# Patient Record
Sex: Male | Born: 1955 | Race: Black or African American | Hispanic: No | Marital: Married | Smoking: Former smoker
Health system: Southern US, Community
[De-identification: ages and names within clinical notes are randomized; demographics above are authoritative.]

## PROBLEM LIST (undated history)

## (undated) DIAGNOSIS — M545 Low back pain, unspecified: Secondary | ICD-10-CM

## (undated) DIAGNOSIS — J449 Chronic obstructive pulmonary disease, unspecified: Secondary | ICD-10-CM

## (undated) DIAGNOSIS — K219 Gastro-esophageal reflux disease without esophagitis: Secondary | ICD-10-CM

## (undated) HISTORY — DX: Gastro-esophageal reflux disease without esophagitis: K21.9

## (undated) HISTORY — DX: Low back pain, unspecified: M54.50

## (undated) HISTORY — DX: Chronic obstructive pulmonary disease, unspecified: J44.9

## (undated) HISTORY — DX: Low back pain: M54.5

---

## 1997-07-15 ENCOUNTER — Emergency Department (HOSPITAL_COMMUNITY): Admission: EM | Admit: 1997-07-15 | Discharge: 1997-07-15 | Payer: Self-pay | Admitting: Emergency Medicine

## 2006-11-22 ENCOUNTER — Encounter: Payer: Self-pay | Admitting: Internal Medicine

## 2006-11-22 ENCOUNTER — Ambulatory Visit: Payer: Self-pay | Admitting: Internal Medicine

## 2006-11-22 DIAGNOSIS — M25519 Pain in unspecified shoulder: Secondary | ICD-10-CM | POA: Insufficient documentation

## 2006-11-22 DIAGNOSIS — M545 Low back pain, unspecified: Secondary | ICD-10-CM | POA: Insufficient documentation

## 2006-11-22 DIAGNOSIS — F411 Generalized anxiety disorder: Secondary | ICD-10-CM

## 2006-11-22 DIAGNOSIS — J45909 Unspecified asthma, uncomplicated: Secondary | ICD-10-CM | POA: Insufficient documentation

## 2006-11-22 LAB — CONVERTED CEMR LAB
ALT: 25 units/L (ref 0–53)
AST: 21 units/L (ref 0–37)
Alkaline Phosphatase: 48 units/L (ref 39–117)
BUN: 10 mg/dL (ref 6–23)
Basophils Absolute: 0 10*3/uL (ref 0.0–0.1)
Basophils Relative: 0.9 % (ref 0.0–1.0)
Bilirubin, Direct: 0.1 mg/dL (ref 0.0–0.3)
CO2: 30 meq/L (ref 19–32)
Eosinophils Relative: 2.5 % (ref 0.0–5.0)
GFR calc Af Amer: 90 mL/min
GFR calc non Af Amer: 74 mL/min
HCT: 42.1 % (ref 39.0–52.0)
HDL: 33.2 mg/dL — ABNORMAL LOW (ref >39.0)
Ketones, ur: NEGATIVE mg/dL
Lymphocytes Relative: 31.1 % (ref 12.0–46.0)
MCV: 92.2 fL (ref 78.0–100.0)
Monocytes Relative: 6.2 % (ref 3.0–11.0)
PSA: 0.29 ng/mL (ref 0.10–4.00)
Platelets: 172 10*3/uL (ref 150–400)
Potassium: 4 meq/L (ref 3.5–5.1)
RBC: 4.56 M/uL (ref 4.22–5.81)
RDW: 12.3 % (ref 11.5–14.6)
Total CHOL/HDL Ratio: 5.1
Triglycerides: 98 mg/dL (ref 0–149)
VLDL: 20 mg/dL (ref 0–40)
pH: 6 (ref 5.0–8.0)

## 2007-03-29 ENCOUNTER — Ambulatory Visit: Payer: Self-pay | Admitting: Internal Medicine

## 2007-03-29 DIAGNOSIS — J069 Acute upper respiratory infection, unspecified: Secondary | ICD-10-CM | POA: Insufficient documentation

## 2007-03-29 DIAGNOSIS — J449 Chronic obstructive pulmonary disease, unspecified: Secondary | ICD-10-CM | POA: Insufficient documentation

## 2009-05-30 ENCOUNTER — Encounter: Payer: Self-pay | Admitting: Internal Medicine

## 2009-06-17 ENCOUNTER — Ambulatory Visit: Payer: Self-pay | Admitting: Internal Medicine

## 2009-06-17 ENCOUNTER — Encounter (INDEPENDENT_AMBULATORY_CARE_PROVIDER_SITE_OTHER): Payer: Self-pay | Admitting: *Deleted

## 2009-06-17 DIAGNOSIS — K219 Gastro-esophageal reflux disease without esophagitis: Secondary | ICD-10-CM | POA: Insufficient documentation

## 2009-06-17 DIAGNOSIS — R05 Cough: Secondary | ICD-10-CM | POA: Insufficient documentation

## 2009-08-05 ENCOUNTER — Ambulatory Visit: Payer: Self-pay | Admitting: Internal Medicine

## 2009-08-05 DIAGNOSIS — R42 Dizziness and giddiness: Secondary | ICD-10-CM

## 2009-08-05 DIAGNOSIS — R519 Headache, unspecified: Secondary | ICD-10-CM | POA: Insufficient documentation

## 2009-08-05 DIAGNOSIS — R51 Headache: Secondary | ICD-10-CM

## 2009-08-05 DIAGNOSIS — J310 Chronic rhinitis: Secondary | ICD-10-CM | POA: Insufficient documentation

## 2009-08-05 DIAGNOSIS — J019 Acute sinusitis, unspecified: Secondary | ICD-10-CM

## 2009-09-02 ENCOUNTER — Ambulatory Visit: Payer: Self-pay | Admitting: Internal Medicine

## 2009-09-10 ENCOUNTER — Encounter: Payer: Self-pay | Admitting: Internal Medicine

## 2009-09-23 ENCOUNTER — Encounter: Admission: RE | Admit: 2009-09-23 | Discharge: 2009-09-23 | Payer: Self-pay | Admitting: Internal Medicine

## 2009-10-06 ENCOUNTER — Encounter: Payer: Self-pay | Admitting: Internal Medicine

## 2009-10-07 ENCOUNTER — Encounter: Payer: Self-pay | Admitting: Internal Medicine

## 2009-10-07 ENCOUNTER — Encounter (INDEPENDENT_AMBULATORY_CARE_PROVIDER_SITE_OTHER): Payer: Self-pay | Admitting: *Deleted

## 2009-10-07 ENCOUNTER — Ambulatory Visit: Payer: Self-pay

## 2009-10-10 ENCOUNTER — Ambulatory Visit: Payer: Self-pay

## 2009-10-10 ENCOUNTER — Encounter: Payer: Self-pay | Admitting: Internal Medicine

## 2009-10-10 ENCOUNTER — Ambulatory Visit: Payer: Self-pay | Admitting: Internal Medicine

## 2009-10-10 ENCOUNTER — Ambulatory Visit (HOSPITAL_COMMUNITY): Admission: RE | Admit: 2009-10-10 | Discharge: 2009-10-10 | Payer: Self-pay | Admitting: Internal Medicine

## 2009-10-23 ENCOUNTER — Telehealth: Payer: Self-pay | Admitting: Internal Medicine

## 2010-03-08 ENCOUNTER — Encounter: Payer: Self-pay | Admitting: Internal Medicine

## 2010-03-15 LAB — CONVERTED CEMR LAB
BUN: 11 mg/dL (ref 6–23)
Basophils Relative: 0.5 % (ref 0.0–3.0)
Bilirubin, Direct: 0.1 mg/dL (ref 0.0–0.3)
Chloride: 106 meq/L (ref 96–112)
Cholesterol: 170 mg/dL (ref 0–200)
Creatinine, Ser: 1.2 mg/dL (ref 0.4–1.5)
Glucose, Bld: 88 mg/dL (ref 70–99)
HCT: 43.2 % (ref 39.0–52.0)
Hemoglobin: 14.5 g/dL (ref 13.0–17.0)
Ketones, ur: NEGATIVE mg/dL
Leukocytes, UA: NEGATIVE
Lymphs Abs: 1.1 10*3/uL (ref 0.7–4.0)
MCHC: 33.6 g/dL (ref 30.0–36.0)
Nitrite: NEGATIVE
Platelets: 194 10*3/uL (ref 150.0–400.0)
RBC: 4.67 M/uL (ref 4.22–5.81)
Specific Gravity, Urine: <=1.005 (ref 1.000–1.030)
Total Bilirubin: 0.6 mg/dL (ref 0.3–1.2)
Total CHOL/HDL Ratio: 6
Total Protein, Urine: NEGATIVE mg/dL
Total Protein: 7.3 g/dL (ref 6.0–8.3)
Triglycerides: 71 mg/dL (ref 0.0–149.0)
VLDL: 14.2 mg/dL (ref 0.0–40.0)
WBC: 4.3 10*3/uL — ABNORMAL LOW (ref 4.5–10.5)

## 2010-03-17 NOTE — Assessment & Plan Note (Signed)
Summary: dizziness/plot/cd   Vital Signs:  Patient profile:   55 year old male Height:      71 inches Weight:      195.50 pounds BMI:     27.37 O2 Sat:      97 % on Room air Temp:     99.4 degrees F oral Pulse rate:   65 / minute BP sitting:   136 / 82  (left arm) Cuff size:   regular  Vitals Entered By: Zella Ball Ewing CMA (AAMA) (September 02, 2009 1:36 PM)  O2 Flow:  Room air CC: Dizzy, head pressure, nauseated/RE   CC:  Dizzy, head pressure, and nauseated/RE.  History of Present Illness: here with dizzy/vertigo, HA and nausea  - still has symptoms despite being seen lasat month;  can still function and work but ha recurring dizziness, head pressure and nausea, sometimes assoc "quiviering" near the left ear;  some pressure ot the areas behnd the ears;  also occasional numbness to the left cheek area but infreq, and only once to the right sinus area 2 days;  Kenneth [name] gets a "rush feleling"  quick and fleeting to the center of the head but doesnt last long enough to get more dizzy or pass out;  feels "foggy" as well;  augmentin tx  makes him  feel "high" but tried to take twice;  also last night for the first time had trouble sleeping  (and hwy he is here today) with nausea, anxiety on lying down  and after oatmeal this am as well.  he is unaware of fevers but has low grade temp today.  Not taking his flonase and clariitn.  No lightheadedness,  Pt denies CP, sob, doe, wheezing, orthopnea, pnd, worsening LE edema, palps, or syncope except for  a fleeting sharp pain to the left costal  margin  - last occurred last wk, non exertionla, nonpleuritic.  Walks the track for excericse  every other day (but not this wk due to the heat).  Stopped coffee x 2 mo, no ETOH, tob, illicit drugs.   Did have excruciating pain to the left neck rad to the left upper back and shoulder after golf, but resolved with PT/integrative therapy.    Preventive Screening-Counseling & Management      Drug Use:  no.     Problems Prior to Update: 1)  Postural Lightheadedness  (ICD-780.4) 2)  Dizziness  (ICD-780.4) 3)  Sinusitis, Acute  (ICD-461.9) 4)  Headache  (ICD-784.0) 5)  Rhinitis, Chronic  (ICD-472.0) 6)  Vertigo  (ICD-780.4) 7)  Physical Examination  (ICD-V70.0) 8)  Cough  (ICD-786.2) 9)  Gerd  (ICD-530.81) 10)  Upper Respiratory Infection (URI)  (ICD-465.9) 11)  COPD  (ICD-496) 12)  Asthma  (ICD-493.90) 13)  Anxiety  (ICD-300.00) 14)  Shoulder Pain  (ICD-719.41) 15)  Low Back Pain  (ICD-724.2)  Medications Prior to Update: 1)  Lorazepam 0.5 Mg Tabs (Lorazepam) .Marland Kitchen.. 1 - 2 Two Times A Day Prn 2)  Vitamin D3 1000 Unit  Tabs (Cholecalciferol) .Marland Kitchen.. 1 Qd 3)  Pantoprazole Sodium 40 Mg Tbec (Pantoprazole Sodium) .Marland Kitchen.. 1 By Mouth Once Daily For Indigestion 4)  Proair Hfa 108 (90 Base) Mcg/act Aers (Albuterol Sulfate) .... 2 Inh Qid As Needed 5)  Flonase 50 Mcg/act Susp (Fluticasone Propionate) .Marland Kitchen.. 1 Spr Each Nostr Qd As Needed 6)  Loratadine 10 Mg Tabs (Loratadine) .Marland Kitchen.. 1 By Mouth Once Daily As Needed Allergies  Current Medications (verified): 1)  Lorazepam 0.5 Mg Tabs (Lorazepam) .Marland Kitchen.. 1 - 2  Two Times A Day Prn 2)  Vitamin D3 1000 Unit  Tabs (Cholecalciferol) .Marland Kitchen.. 1 Qd 3)  Pantoprazole Sodium 40 Mg Tbec (Pantoprazole Sodium) .Marland Kitchen.. 1 By Mouth Once Daily For Indigestion 4)  Proair Hfa 108 (90 Base) Mcg/act Aers (Albuterol Sulfate) .... 2 Inh Qid As Needed 5)  Flonase 50 Mcg/act Susp (Fluticasone Propionate) .Marland Kitchen.. 1 Spr Each Nostr Qd As Needed 6)  Loratadine 10 Mg Tabs (Loratadine) .Marland Kitchen.. 1 By Mouth Once Daily As Needed Allergies 7)  Meclizine Hcl 12.5 Mg Tabs (Meclizine Hcl) .Marland Kitchen.. 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 Occupation: Agricultural engineer. of insurance, auditing Married Former Smoker Regular exercise-yes Alcohol use-no Drug use-no Drug Use:  no  Review of Systems       all otherwise negative per pt -  except for fleeting shapr pains to ears with occas tinnitus  Physical Exam  General:  alert and overweight-appearing.  , nontoxic, occasionally smiling Head:  normocephalic and atraumatic.   Eyes:  vision grossly intact, pupils equal, and pupils round.   Ears:  left tm slight eythema Nose:  no external deformity and no nasal discharge.   Mouth:  no gingival abnormalities and pharynx pink and moist.   Neck:  supple and no masses.   Lungs:  normal respiratory effort and normal breath sounds.   Heart:  normal rate and regular rhythm.   Abdomen:  soft, non-tender, and normal bowel sounds.   Msk:  no joint tenderness and no joint swelling.   Extremities:  no edema, no erythema  Neurologic:  cranial nerves II-XII intact, strength normal in all extremities, and gait normal.     Impression & Recommendations:  Problem # 1:  DIZZINESS (ICD-780.4)  His updated medication list for this problem includes:    Loratadine 10 Mg Tabs (Loratadine) .Marland Kitchen... 1 by mouth once daily as needed allergies    Meclizine Hcl 12.5 Mg Tabs (Meclizine hcl) .Marland Kitchen... 1po q 6 hrs as needed dizzy etiology unclear; pt to re-start the sinus meds, recent may 2011 labs reviewed with pt, further tx and w/u as below  Orders: Radiology Referral (Radiology) Neurology Referral (Neuro)  Problem # 2:  VERTIGO (ICD-780.4)  His updated medication list for this problem includes:    Loratadine 10 Mg Tabs (Loratadine) .Marland Kitchen... 1 by mouth once daily as needed allergies    Meclizine Hcl 12.5 Mg Tabs  (Meclizine hcl) .Marland Kitchen... 1po q 6 hrs as needed dizzy also for meclizine as needed , and MRI, and neuro referral, also for mucinex otc two times a day as needed   Orders: Neurology Referral (Neuro)  Problem # 3:  POSTURAL LIGHTHEADEDNESS (ICD-780.4)  His updated medication list for this problem includes:    Loratadine 10 Mg Tabs (Loratadine) .Marland Kitchen... 1 by mouth once daily as needed allergies    Meclizine Hcl 12.5 Mg Tabs (Meclizine hcl) .Marland Kitchen... 1po q 6 hrs as needed dizzy also for echo, carotids  Orders: Echo Referral (Echo) Misc. Referral (Misc. Ref)  Problem # 4:  ANXIETY (ICD-300.00)  His updated medication list for this  problem includes:    Lorazepam 0.5 Mg Tabs (Lorazepam) .Marland Kitchen... 1 - 2 two times a day prn reassured - ok to take the lorazepam asd  Complete Medication List: 1)  Lorazepam 0.5 Mg Tabs (Lorazepam) .Marland Kitchen.. 1 - 2 two times a day prn 2)  Vitamin D3 1000 Unit Tabs (Cholecalciferol) .Marland Kitchen.. 1 qd 3)  Pantoprazole Sodium 40 Mg Tbec (Pantoprazole sodium) .Marland Kitchen.. 1 by mouth once daily for indigestion 4)  Proair Hfa 108 (90 Base) Mcg/act Aers (Albuterol sulfate) .... 2 inh qid as needed 5)  Flonase 50 Mcg/act Susp (Fluticasone propionate) .Marland Kitchen.. 1 spr each nostr qd as needed 6)  Loratadine 10 Mg Tabs (Loratadine) .Marland Kitchen.. 1 by mouth once daily as needed allergies 7)  Meclizine Hcl 12.5 Mg Tabs (Meclizine hcl) .Marland Kitchen.. 1po q 6 hrs as needed dizzy  Patient Instructions: 1)  Please take all new medications as prescribed - the meclzine as needed for dizziness 2)  Continue all previous medications as before this visit, including the sinus medications and lorazepam 3)  You can also use Mucinex OTC or it's generic for the symptoms 4)  You will be contacted about the referral(s) to: MRI, and neurology, as well as Echocardiogram and Carotid Dopplers 5)  Please schedule an appointment with your primary doctor in : 6)  Please schedule a follow-up appointment in 1 month or sooner if  needed Prescriptions: MECLIZINE HCL 12.5 MG TABS (MECLIZINE HCL) 1po q 6 hrs as needed dizzy  #40 x 1   Entered and Authorized by:   Corwin Levins MD   Signed by:   Corwin Levins MD on 09/02/2009   Method used:   Print then Give to Patient   RxID:   [id number]

## 2010-03-17 NOTE — Miscellaneous (Signed)
Summary: Appointment Canceled  Appointment status changed to canceled by [employer] on 10/07/2009 4:52 PM.  Cancellation Comments --------------------- echo/POSTURAL LIGHTHEADEDNESS  Appointment Information ----------------------- Appt Type:  CARDIOLOGY ANCILLARY VISIT      Date:  Tuesday, October 07, 2009      Time:  4:00 PM for 60 min   Urgency:  Routine   Made By:  Pearson Grippe  To Visit:  [id number]    Reason:  echo/POSTURAL LIGHTHEADEDNESS  Appt Comments ------------- -- 10/07/09 16:52: (CEMR) CANCELED -- echo/POSTURAL LIGHTHEADEDNESS -- 10/07/09 15:13: (CEMR) ARRIVED -- echo/POSTURAL LIGHTHEADEDNESS -- 09/22/09 8:48: (CEMR) BOOKED -- Routine CARDIOLOGY ANCILLARY VISIT at 10/07/2009 4:00 PM for 60 min echo/POSTURAL L

## 2010-03-17 NOTE — Letter (Signed)
Summary: [facility]  [facility]   Imported By: Sherian Rein 07/03/2009 09:42:46  _____________________________________________________________________  External Attachment:    Type:   Image     Comment:   External Document

## 2010-03-17 NOTE — Miscellaneous (Signed)
Summary: Appointment Canceled  Appointment status changed to canceled by [employer] on 09/22/2009 8:48 AM.  Cancellation Comments --------------------- echo/POSTURAL LIGHTHEADEDNESS  Appointment Information ----------------------- Appt Type:  CARDIOLOGY ANCILLARY VISIT      Date:  Tuesday, September 23, 2009      Time:  2:00 PM for 60 min   Urgency:  Routine   Made By:  Pearson Grippe  To Visit:  [id number]    Reason:  echo/POSTURAL LIGHTHEADEDNESS  Appt Comments ------------- -- 09/22/09 8:48: (CEMR) CANCELED -- echo/POSTURAL LIGHTHEADEDNESS -- 09/03/09 10:48: (CEMR) BOOKED -- Routine CARDIOLOGY ANCILLARY VISIT at 09/23/2009 2:00 PM for 60 min echo/POSTURAL LIGHTHEADEDNESS

## 2010-03-17 NOTE — Letter (Signed)
Summary: Outpatient Coinsurance Notice  Outpatient Coinsurance Notice   Imported By: Marylou Mccoy 10/17/2009 10:43:21  _____________________________________________________________________  External Attachment:    Type:   Image     Comment:   External Document

## 2010-03-17 NOTE — Assessment & Plan Note (Signed)
Summary: WOKE UP W/COUGH/CD   Vital Signs:  Patient profile:   55 year old Kenneth [name] Height:      71 inches Weight:      207.25 pounds (94.20 kg) BMI:     29.01 O2 Sat:      98 % on Room air Temp:     98.0 degrees F (36.67 degrees C) oral Pulse rate:   78 / minute Pulse rhythm:   regular BP sitting:   134 / 100  (left arm) Cuff size:   regular  Vitals Entered By: Brenton Grills (Jun 17, 2009 11:23 AM)  O2 Flow:  Room air CC: pt c/o dry cough usually in AM but can last all day/pt states he notices cough after drinking beverages/pt states he is no longer taking lorazepam, mobic, zithromax/pt states he may need a refill on ventolin and has a question about vitamin d3/pt may be due for tetanus/aj   CC:  pt c/o dry cough usually in AM but can last all day/pt states he notices cough after drinking beverages/pt states he is no longer taking lorazepam, mobic, and zithromax/pt states he may need a refill on ventolin and has a question about vitamin d3/pt may be due for tetanus/aj.  History of Present Illness: C/o dry hard cough spells off and on up to 5 min after he drinks x 2 years - getting worse; spells 3/wk. C/o GERD The patient presents for a wellness examination  C/o L shoulder pain - had seen ortho - now on PT  Current Medications (verified): 1)  Lorazepam 0.5 Mg Tabs (Lorazepam) .Marland Kitchen.. 1 - 2 Two Times A Day Prn 2)  Mobic 15 Mg Tabs (Meloxicam) .... 1/2 or 1 By Mouth Once Daily Pc Prn 3)  Ventolin Hfa 108 (90 Base) Mcg/act  Aers (Albuterol Sulfate) .... 2 Inh Qid Prn 4)  Zithromax Z-Pak 250 Mg  Tabs (Azithromycin) .... As Directed 5)  Vitamin D3 1000 Unit  Tabs (Cholecalciferol) .Marland Kitchen.. 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 F lymphoma  Social History: Occupation: Agricultural engineer. of insurance, auditing Married Former Smoker Regular exercise-yes Alcohol use-no  Review of Systems  The patient denies anorexia, fever, weight loss, weight gain, vision loss, decreased hearing, hoarseness, chest pain, syncope, dyspnea on exertion, peripheral edema, prolonged cough, headaches, hemoptysis, abdominal pain, melena, hematochezia, severe indigestion/heartburn, hematuria, incontinence, genital sores, muscle weakness, suspicious skin lesions, transient blindness, difficulty walking, depression, unusual weight change, abnormal bleeding, enlarged lymph nodes, angioedema, and testicular masses.    Physical Exam  General:  Well-developed,well-nourished,in no acute distress; alert,appropriate and cooperative throughout examination Head:  Normocephalic and atraumatic without obvious abnormalities. No apparent alopecia or balding. Eyes:  No corneal or conjunctival inflammation noted. EOMI. Perrla Ears:  External ear exam shows no significant lesions or deformities.  Otoscopic examination reveals clear canals, tympanic membranes are intact bilaterally without bulging, retraction, inflammation or discharge. Hearing is grossly normal bilaterally. Nose:  External nasal examination shows no deformity or inflammation. Nasal mucosa are pink and moist without lesions or exudates. Mouth:  Eryth throat Neck:  No deformities, masses, or tenderness noted. Lungs:  B rhonchi Heart:  RRR Abdomen:  Bowel  sounds positive,abdomen soft and non-tender without masses, organomegaly or hernias noted. Rectal:  No external abnormalities noted. Normal sphincter tone. No rectal masses or tenderness. G(-) Prostate:  Prostate gland firm and smooth, no enlargement, nodularity, tenderness, mass, asymmetry or induration. Msk:   No deformity or scoliosis noted of thoracic or lumbar spine.   Extremities:  No clubbing, cyanosis, edema, or deformity noted with normal full range of motion of all joints.   Neurologic:  No cranial nerve deficits noted. Station and gait are normal. Plantar reflexes are down-going bilaterally. DTRs are symmetrical throughout. Sensory, motor and coordinative functions appear intact. Skin:  Intact without suspicious lesions or rashes Cervical Nodes:  No lymphadenopathy noted Inguinal Nodes:  No significant adenopathy Psych:  Cognition and judgment appear intact. Alert and cooperative with normal attention span and concentration. No apparent delusions, illusions, hallucinations   Impression & Recommendations:  Problem # 1:  PHYSICAL EXAMINATION (ICD-V70.0) Assessment New Health and age related issues were discussed. Available screening tests and vaccinations were discussed as well. Healthy life style including good diet and execise was discussed. Needs Colon  Orders: TLB-BMP (Basic Metabolic Panel-BMET) (80048-METABOL) TLB-CBC Platelet - w/Differential (85025-CBCD) TLB-Hepatic/Liver Function Pnl (80076-HEPATIC) TLB-Lipid Panel (80061-LIPID) TLB-PSA (Prostate Specific Antigen) (84153-PSA) TLB-TSH (Thyroid Stimulating Hormone) (84443-TSH) TLB-Udip ONLY (81003-UDIP)  Problem # 2:  COUGH (ICD-786.2) Assessment: New Unclear etiology. Treat GERD. GI consult. Albuterol as needed. Orders: T-2 View CXR, Same Day (71020.5TC)  Problem # 3:  GERD (ICD-530.81) Assessment: New  His updated medication list for this problem includes:    Pantoprazole Sodium 40 Mg Tbec (Pantoprazole sodium) .Marland Kitchen... 1 by mouth once daily for indigestion  Orders: Gastroenterology Referral (GI)  Problem # 4:  COPD (ICD-496) Assessment: Unchanged  The following medications were removed from the medication list:    Ventolin Hfa 108 (90 Base) Mcg/act Aers (Albuterol sulfate) .Marland Kitchen... 2 inh qid prn His updated medication  list for this problem includes:    Proair Hfa 108 (90 Base) Mcg/act Aers (Albuterol sulfate) .Marland Kitchen... 2 inh qid as needed  Complete Medication List: 1)  Lorazepam 0.5 Mg Tabs (Lorazepam) .Marland Kitchen.. 1 - 2 two times a day prn 2)  Vitamin D3 1000 Unit Tabs (Cholecalciferol) .Marland Kitchen.. 1 qd 3)  Pantoprazole Sodium 40 Mg Tbec (Pantoprazole sodium) .Marland Kitchen.. 1 by mouth once daily for indigestion 4)  Proair Hfa 108 (90 Base) Mcg/act Aers (Albuterol sulfate) .... 2 inh qid as needed  Patient Instructions: 1)  Please schedule a follow-up appointment in 3 months. Prescriptions: PROAIR HFA 108 (90 BASE) MCG/ACT AERS (ALBUTEROL SULFATE) 2 inh qid as needed  #1 x 3   Entered and Authorized by:   Tresa Garter MD   Signed by:   Tresa Garter MD on 06/17/2009   Method used:   Print then Give to Patient   RxID:   [id number] PANTOPRAZOLE SODIUM 40 MG TBEC (PANTOPRAZOLE SODIUM) 1 by mouth once daily for indigestion  #30 x 12   Entered and Authorized by:   Tresa Garter MD   Signed by:   Tresa Garter MD on 06/17/2009   Method used:   Print then Give to Patient   RxID:   [id number]

## 2010-03-17 NOTE — Letter (Signed)
Summary: New Patient letter  [facility]  [address] [place], [place] [address]   Phone: [phone]  Fax: [phone]       06/17/2009 MRN: [id number]  Kenneth [name] [address] [place], [place]  [address]  Dear Mr. Kenneth [name],  Welcome to the Gastroenterology Division at [facility].    You are scheduled to see Dr.  Leone Payor on 07-17-09 at 2:45PM on the 3rd floor at [facility], [address]  We ask that you try to arrive at our office 15 minutes prior to your appointment time to allow for check-in.  We would like you to complete the enclosed self-administered evaluation form prior to your visit and bring it with you on the day of your appointment.  We will review it with you.  Also, please bring a complete list of all your medications or, if you prefer, bring the medication bottles and we will list them.  Please bring your insurance card so that we may make a copy of it.  If your insurance requires a referral to see a specialist, please bring your referral form from your primary care physician.  Co-payments are due at the time of your visit and may be paid by cash, check or credit card.     Your office visit will consist of a consult with your physician (includes a physical exam), any laboratory testing he/she may order, scheduling of any necessary diagnostic testing (e.g. x-ray, ultrasound, CT-scan), and scheduling of a procedure (e.g. Endoscopy, Colonoscopy) if required.  Please allow enough time on your schedule to allow for any/all of these possibilities.    If you cannot keep your appointment, please call [phone] to cancel or reschedule prior to your appointment date.  This allows [place] the opportunity to schedule an appointment for another patient in need of care.  If you do not cancel or reschedule by 5 p.m. the business day prior to your appointment date, you will be charged a $50.00 late cancellation/no-show fee.    Thank you for choosing [facility] for your medical needs.  We appreciate the opportunity to care for you.  Please visit [place] at our website  to learn more about our practice.                     Sincerely,                                                             The Gastroenterology Division

## 2010-03-17 NOTE — Consult Note (Signed)
Summary: [facility]  [facility]   Imported By: Lester [employer] 09/15/2009 10:18:40  _____________________________________________________________________  External Attachment:    Type:   Image     Comment:   External Document

## 2010-03-17 NOTE — Progress Notes (Signed)
Summary: REFERRAL  Phone Note Call from Patient Call back at Home Phone [phone] Call back at [phone]   Summary of Call: Patient is requesting referral to ENT. Continues to c/o humming in his ears.  Initial call taken by: Lamar Sprinkles, CMA,  October 23, 2009 9:58 AM  Follow-up for Phone Call        ok Follow-up by: Tresa Garter MD,  October 23, 2009 12:40 PM

## 2010-03-17 NOTE — Assessment & Plan Note (Signed)
Summary: dizziness/#/cd   Vital Signs:  Patient profile:   55 year old male Weight:      195 pounds BMI:     27.30 O2 Sat:      96 % on Room air Temp:     97.6 degrees F oral Pulse rate:   70 / minute Resp:     16 per minute BP supine:   130 / 80 BP sitting:   130 / 80  (left arm) BP standing:   130 / 80 Cuff size:   regular  Vitals Entered By: Waldron Labs, CMA(AAMA) (August 05, 2009 4:44 PM)  O2 Flow:  Room air CC: dizziness  Comments pt states he is not taking Lorazepam, Vitamin D, Pantoprazole or ProAir.   CC:  dizziness .  History of Present Illness: C/o dizziness x 1 months and nasal congestion x 1 months; a lot of nasal drainage and HA, "foggy". It has started after a cold episode... F/u anxiety  Current Medications (verified): 1)  Lorazepam 0.5 Mg Tabs (Lorazepam) .Marland Kitchen.. 1 - 2 Two Times A Day Prn 2)  Vitamin D3 1000 Unit  Tabs (Cholecalciferol) .Marland Kitchen.. 1 Qd 3)  Pantoprazole Sodium 40 Mg Tbec (Pantoprazole Sodium) .Marland Kitchen.. 1 By Mouth Once Daily For Indigestion 4)  Proair Hfa 108 (90 Base) Mcg/act Aers (Albuterol Sulfate) .... 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         HA  Physical Exam  General:  Well-developed,well-nourished,in no acute distress; alert,appropriate and cooperative throughout examination Ears:  External ear exam shows no significant lesions or deformities.  Otoscopic examination reveals clear canals, tympanic membranes are intact bilaterally without bulging, retraction, inflammation or discharge. Hearing is grossly  normal bilaterally. Nose:  Erythematous throat and intranasal mucosa c/w URI  Nasal passages are very swollen, white d/c Neck:  No deformities, masses, or tenderness noted. Lungs:  B rhonchi Heart:  RRR Abdomen:  Bowel sounds positive,abdomen soft and non-tender without masses, organomegaly or hernias noted. Msk:  No deformity or scoliosis noted of thoracic or lumbar spine.   Extremities:  No clubbing, cyanosis, edema, or deformity noted with normal full range of motion of all joints.   Neurologic:  No cranial nerve deficits noted. Station and gait are normal. Plantar reflexes are down-going bilaterally. DTRs are symmetrical throughout. Sensory, motor and coordinative functions appear intact. H-P (-) B Skin:  Intact without suspicious lesions or rashes Psych:  Cognition and judgment appear intact. Alert and cooperative with normal attention span and concentration. No apparent delusions, illusions, hallucinations   Impression & Recommendations:  Problem # 1:  VERTIGO (ICD-780.4) - poss sinusitis Assessment New  His updated medication list for this problem includes:    Loratadine 10 Mg Tabs (Loratadine) .Marland Kitchen... 1 by mouth once daily as needed allergies  Problem # 2:  RHINITIS, CHRONIC (ICD-472.0) Assessment: Deteriorated Flonase Claritin  Problem # 3:  HEADACHE (ICD-784.0) due to sinusitis Assessment: New CT if not better Augmentin  Problem # 4:  ANXIETY (ICD-300.00) Assessment: Unchanged  His updated medication list for this problem includes:    Lorazepam 0.5 Mg Tabs (Lorazepam) .Marland Kitchen... 1 - 2 two  times a day prn  Problem # 5:  SINUSITIS, ACUTE (ICD-461.9) Assessment: New  His updated medication list for this problem includes:    Flonase 50 Mcg/act Susp (Fluticasone propionate) .Marland Kitchen... 1 spr each nostr qd as needed    Augmentin 875-125 Mg Tabs (Amoxicillin-pot clavulanate) .Marland Kitchen... 1 by mouth bid  Complete Medication List: 1)  Lorazepam 0.5 Mg Tabs (Lorazepam) .Marland Kitchen.. 1 - 2 two times  a day prn 2)  Vitamin D3 1000 Unit Tabs (Cholecalciferol) .Marland Kitchen.. 1 qd 3)  Pantoprazole Sodium 40 Mg Tbec (Pantoprazole sodium) .Marland Kitchen.. 1 by mouth once daily for indigestion 4)  Proair Hfa 108 (90 Base) Mcg/act Aers (Albuterol sulfate) .... 2 inh qid as needed 5)  Flonase 50 Mcg/act Susp (Fluticasone propionate) .Marland Kitchen.. 1 spr each nostr qd as needed 6)  Loratadine 10 Mg Tabs (Loratadine) .Marland Kitchen.. 1 by mouth once daily as needed allergies 7)  Augmentin 875-125 Mg Tabs (Amoxicillin-pot clavulanate) .Marland Kitchen.. 1 by mouth bid  Patient Instructions: 1)  Use the Sinus rinse as needed 2)  Use over-the-counter medicines for "cold": Tylenol  650mg  or Advil 400mg  every 6 hours  for fever; Delsym or Robutussin for cough. Mucinex or Mucinex D for congestion. Ricola or Halls for sore throat. Office visit if not better or if worse.  Prescriptions: VITAMIN D3 1000 UNIT  TABS (CHOLECALCIFEROL) 1 qd  #100 x 3   Entered and Authorized by:   Tresa Garter MD   Signed by:   Tresa Garter MD on 08/05/2009   Method used:   Print then Give to Patient   RxID:   [id number] LORAZEPAM 0.5 MG TABS (LORAZEPAM) 1 - 2 two times a day prn  #60 x 3   Entered and Authorized by:   Tresa Garter MD   Signed by:   Tresa Garter MD on 08/05/2009   Method used:   Print then Give to Patient   RxID:   [id number] AUGMENTIN 875-125 MG TABS (AMOXICILLIN-POT CLAVULANATE) 1 by mouth bid  #20 x 0   Entered and Authorized by:   Tresa Garter MD   Signed by:   Tresa Garter MD on 08/05/2009   Method used:   Print then Give to Patient   RxID:   [id number] LORATADINE 10 MG TABS (LORATADINE) 1 by mouth once daily as needed allergies  #30 x 6   Entered and Authorized by:   Tresa Garter MD   Signed by:   Tresa Garter MD on 08/05/2009   Method used:   Print then Give to Patient   RxID:   [id number] FLONASE 50 MCG/ACT SUSP (FLUTICASONE PROPIONATE) 1 spr each nostr qd as needed  #3 x  3   Entered and Authorized by:   Tresa Garter MD   Signed by:   Tresa Garter MD on 08/05/2009   Method used:   Print then Give to Patient   RxID:   [id number]

## 2010-03-17 NOTE — Miscellaneous (Signed)
Summary: Orders Update  Clinical Lists Changes  Orders: Added new Test order of Carotid Duplex (Carotid Duplex) - Signed 

## 2010-04-16 ENCOUNTER — Telehealth: Payer: Self-pay | Admitting: Internal Medicine

## 2010-04-23 NOTE — Progress Notes (Signed)
Summary: Rx request  Phone Note From Pharmacy   Call For: Lorazepam  Reason for Call: Needs renewal Summary of Call: [facility] [address] [phone] requesting Lorazepam 0.5mg  Tab #60x3 Initial call taken by: Burnard Leigh [place] [facility]),  April 16, 2010 9:02 AM  Follow-up for Phone Call        ok x1 Follow-up by: Tresa Garter MD,  April 16, 2010 5:30 PM    Prescriptions: LORAZEPAM 0.5 MG TABS (LORAZEPAM) 1 - 2 two times a day prn  #60 x 1   Entered by:   Vertis Kelch)   Authorized by:   Tresa Garter MD   Signed by:   Burnard Leigh CMA(AAMA) on 04/17/2010   Method used:   Telephoned to ...       [facility]  [address] [phone]* (retail)       [address]       [place], [place]  [id number]       Ph: [phone] or [phone]       Fax: [phone]   RxID:   [id number]

## 2010-06-15 ENCOUNTER — Telehealth: Payer: Self-pay | Admitting: *Deleted

## 2010-06-15 ENCOUNTER — Ambulatory Visit (INDEPENDENT_AMBULATORY_CARE_PROVIDER_SITE_OTHER): Payer: BC Managed Care – PPO | Admitting: Internal Medicine

## 2010-06-15 ENCOUNTER — Encounter: Payer: Self-pay | Admitting: Internal Medicine

## 2010-06-15 DIAGNOSIS — J45909 Unspecified asthma, uncomplicated: Secondary | ICD-10-CM

## 2010-06-15 DIAGNOSIS — J310 Chronic rhinitis: Secondary | ICD-10-CM

## 2010-06-15 DIAGNOSIS — J019 Acute sinusitis, unspecified: Secondary | ICD-10-CM

## 2010-06-15 MED ORDER — ALBUTEROL SULFATE HFA 108 (90 BASE) MCG/ACT IN AERS: 2.0000 | INHALATION_SPRAY | Freq: Four times a day (QID) | RESPIRATORY_TRACT | Status: DC | PRN

## 2010-06-15 MED ORDER — AMOXICILLIN-POT CLAVULANATE 500-125 MG PO TABS: 1.0000 | ORAL_TABLET | Freq: Three times a day (TID) | ORAL | Status: DC

## 2010-06-15 MED ORDER — METHYLPREDNISOLONE ACETATE 80 MG/ML IJ SUSP
120.0000 mg | Freq: Once | INTRAMUSCULAR | Status: AC
  Administered 2010-06-15: 120 mg via INTRAMUSCULAR

## 2010-06-15 MED ORDER — FLUTICASONE PROPIONATE 50 MCG/ACT NA SUSP: 2.0000 | Freq: Every day | NASAL | Status: DC

## 2010-06-15 NOTE — Assessment & Plan Note (Signed)
He is having a flare, will give him depo-medrol IM today and restart flonase NS

## 2010-06-15 NOTE — Progress Notes (Signed)
Subjective:    [name], male    DOB: 06/13/1955, 56 y.o.   MRN: [id number]  URI  This is a new problem. The current episode started yesterday. The problem has been unchanged. There has been no fever. Associated symptoms include congestion, rhinorrhea, sinus pain and sneezing. Pertinent negatives include no abdominal pain, chest pain, coughing, diarrhea, dysuria, ear pain, headaches, joint pain, joint swelling, nausea, neck pain, plugged ear sensation, rash, sore throat, swollen glands, vomiting or wheezing. He has tried antihistamine for the symptoms. The treatment provided mild relief.      Review of Systems  Constitutional: Negative for fever, chills, diaphoresis, activity change, appetite change, fatigue and unexpected weight change.  HENT: Positive for congestion, facial swelling, rhinorrhea, sneezing, postnasal drip and sinus pressure. Negative for hearing loss, ear pain, nosebleeds, sore throat, drooling, mouth sores, trouble swallowing, neck pain, neck stiffness, dental problem, voice change, tinnitus and ear discharge.   Eyes: Positive for redness and itching. Negative for photophobia, pain, discharge and visual disturbance.  Respiratory: Negative for apnea, cough, choking, chest tightness, shortness of breath, wheezing and stridor.   Cardiovascular: Negative for chest pain, palpitations and leg swelling.  Gastrointestinal: Negative for nausea, vomiting, abdominal pain and diarrhea.  Genitourinary: Negative for dysuria.  Musculoskeletal: Negative for myalgias, back pain, joint pain, joint swelling, arthralgias and gait problem.  Skin: Negative for color change, pallor and rash.  Neurological: Negative for dizziness, tremors, seizures, syncope, facial asymmetry, speech difficulty, light-headedness, numbness and headaches.  Hematological: Negative for adenopathy. Does not bruise/bleed easily.  Psychiatric/Behavioral: Negative for behavioral problems, confusion, dysphoric  mood and agitation.       Objective:   Physical Exam  Vitals reviewed. Constitutional: He is oriented to person, place, and time. He appears well-developed and well-nourished. No distress.  HENT:  Head: Normocephalic and atraumatic. Head is with right periorbital erythema (mild redness and swelling below the eye) and with left periorbital erythema (mild redness and swelling below the eye). Head is without raccoon's eyes, without Battle's sign, without abrasion and without contusion.    Right Ear: External ear normal.  Left Ear: External ear normal.  Nose: Mucosal edema and rhinorrhea present. No sinus tenderness. No epistaxis.  No foreign bodies. Right sinus exhibits no maxillary sinus tenderness and no frontal sinus tenderness. Left sinus exhibits no maxillary sinus tenderness and no frontal sinus tenderness.  Mouth/Throat: Oropharynx is clear and moist. No oropharyngeal exudate.  Eyes: Conjunctivae and lids are normal. Right eye exhibits no chemosis, no discharge, no exudate and no hordeolum. No foreign body present in the right eye. Left eye exhibits no chemosis, no discharge, no exudate and no hordeolum. No foreign body present in the left eye. Right conjunctiva is not injected. Right conjunctiva has no hemorrhage. Left conjunctiva is not injected. Left conjunctiva has no hemorrhage. No scleral icterus. Right eye exhibits normal extraocular motion (no ptosis or proptosis) and no nystagmus. Left eye exhibits normal extraocular motion (no ptosis or proptosis) and no nystagmus.  Neck: Normal range of motion. Neck supple. No JVD present. No tracheal deviation present. No thyromegaly present.  Cardiovascular: Normal rate, regular rhythm, normal heart sounds and intact distal pulses.  Exam reveals no gallop and no friction rub.   No murmur heard. Pulmonary/Chest: Effort normal and breath sounds normal. No stridor. No respiratory distress. He has no wheezes. He has no rales. He exhibits no  tenderness.  Abdominal: Soft. Bowel sounds are normal. He exhibits no distension and no mass. There is no  tenderness. There is no rebound and no guarding.  Musculoskeletal: Normal range of motion. He exhibits no edema and no tenderness.  Lymphadenopathy:    He has no cervical adenopathy.  Neurological: He is alert and oriented to person, place, and time. He has normal reflexes. He displays normal reflexes. No cranial nerve deficit. He exhibits normal muscle tone. Coordination normal.  Skin: Skin is warm and dry. No rash noted. He is not diaphoretic. No pallor.  Psychiatric: He has a normal mood and affect. His behavior is normal. Judgment and thought content normal.          Assessment & Plan:

## 2010-06-15 NOTE — Assessment & Plan Note (Signed)
Start augmentin 

## 2010-06-15 NOTE — Telephone Encounter (Signed)
1. Pt c/o nose bleeds with flonase - wants alt rx 2. Proventil is not covered, needs rx for proair OR ventolin

## 2010-06-15 NOTE — Patient Instructions (Signed)

## 2010-06-16 MED ORDER — ALBUTEROL SULFATE HFA 108 (90 BASE) MCG/ACT IN AERS: 2.0000 | INHALATION_SPRAY | Freq: Four times a day (QID) | RESPIRATORY_TRACT | Status: DC | PRN

## 2010-06-16 MED ORDER — MOMETASONE FUROATE 50 MCG/ACT NA SUSP: 2.0000 | Freq: Every day | NASAL | Status: DC

## 2010-06-16 NOTE — Telephone Encounter (Signed)
Left detailed vm on pt's hm # (OK per HIPPA FORM)

## 2010-06-16 NOTE — Telephone Encounter (Signed)
Hold Flonase. Use Netti pot and/or Saline spray instead. In 2 wks start Nasonex 1 spr each nostr qd. OV in 4-6 wks OK Ventolin HFA 1 inh (same directions) with 11 ref Thx

## 2010-06-24 ENCOUNTER — Encounter: Payer: Self-pay | Admitting: Internal Medicine

## 2010-06-25 ENCOUNTER — Encounter: Payer: Self-pay | Admitting: Internal Medicine

## 2010-06-25 ENCOUNTER — Telehealth: Payer: Self-pay | Admitting: Internal Medicine

## 2010-06-25 ENCOUNTER — Ambulatory Visit (INDEPENDENT_AMBULATORY_CARE_PROVIDER_SITE_OTHER): Payer: BC Managed Care – PPO | Admitting: Internal Medicine

## 2010-06-25 ENCOUNTER — Other Ambulatory Visit (INDEPENDENT_AMBULATORY_CARE_PROVIDER_SITE_OTHER): Payer: BC Managed Care – PPO

## 2010-06-25 VITALS — BP 120/90 | Temp 98.3°F | Ht 71.0 in | Wt 206.0 lb

## 2010-06-25 DIAGNOSIS — Z0000 Encounter for general adult medical examination without abnormal findings: Secondary | ICD-10-CM

## 2010-06-25 DIAGNOSIS — Z136 Encounter for screening for cardiovascular disorders: Secondary | ICD-10-CM

## 2010-06-25 DIAGNOSIS — R42 Dizziness and giddiness: Secondary | ICD-10-CM

## 2010-06-25 DIAGNOSIS — R51 Headache: Secondary | ICD-10-CM

## 2010-06-25 DIAGNOSIS — F410 Panic disorder [episodic paroxysmal anxiety] without agoraphobia: Secondary | ICD-10-CM

## 2010-06-25 DIAGNOSIS — F411 Generalized anxiety disorder: Secondary | ICD-10-CM

## 2010-06-25 DIAGNOSIS — K219 Gastro-esophageal reflux disease without esophagitis: Secondary | ICD-10-CM

## 2010-06-25 LAB — LIPID PANEL
Cholesterol: 150 mg/dL (ref 0–200)
Triglycerides: 51 mg/dL (ref 0.0–149.0)

## 2010-06-25 LAB — URINALYSIS
Bilirubin Urine: NEGATIVE
Hgb urine dipstick: NEGATIVE
Total Protein, Urine: NEGATIVE
Urine Glucose: NEGATIVE
Urobilinogen, UA: 0.2 (ref 0.0–1.0)

## 2010-06-25 LAB — CBC WITH DIFFERENTIAL/PLATELET
Basophils Absolute: 0 10*3/uL (ref 0.0–0.1)
Eosinophils Absolute: 0.1 10*3/uL (ref 0.0–0.7)
HCT: 42.9 % (ref 39.0–52.0)
Lymphs Abs: 1.4 10*3/uL (ref 0.7–4.0)
MCV: 93.3 fl (ref 78.0–100.0)
Monocytes Absolute: 0.3 10*3/uL (ref 0.1–1.0)
Neutrophils Relative %: 68 % (ref 43.0–77.0)
Platelets: 193 10*3/uL (ref 150.0–400.0)
RDW: 13.1 % (ref 11.5–14.6)

## 2010-06-25 LAB — COMPREHENSIVE METABOLIC PANEL
Albumin: 4.2 g/dL (ref 3.5–5.2)
Alkaline Phosphatase: 45 U/L (ref 39–117)
CO2: 28 mEq/L (ref 19–32)
Calcium: 9.1 mg/dL (ref 8.4–10.5)
Chloride: 105 mEq/L (ref 96–112)
GFR: 82.71 mL/min (ref >60.00)
Glucose, Bld: 74 mg/dL (ref 70–99)
Potassium: 4.2 mEq/L (ref 3.5–5.1)
Sodium: 141 mEq/L (ref 135–145)
Total Protein: 7.2 g/dL (ref 6.0–8.3)

## 2010-06-25 LAB — VITAMIN B12: Vitamin B-12: 426 pg/mL (ref 211–911)

## 2010-06-25 MED ORDER — ALBUTEROL SULFATE HFA 108 (90 BASE) MCG/ACT IN AERS: 2.0000 | INHALATION_SPRAY | Freq: Four times a day (QID) | RESPIRATORY_TRACT | Status: DC | PRN

## 2010-06-25 MED ORDER — LORAZEPAM 0.5 MG PO TABS: 0.5000 mg | ORAL_TABLET | Freq: Two times a day (BID) | ORAL | Status: DC

## 2010-06-25 MED ORDER — PANTOPRAZOLE SODIUM 40 MG PO TBEC: 40.0000 mg | DELAYED_RELEASE_TABLET | Freq: Every day | ORAL | Status: DC

## 2010-06-25 MED ORDER — GABAPENTIN 100 MG PO CAPS: 100.0000 mg | ORAL_CAPSULE | Freq: Three times a day (TID) | ORAL | Status: DC | PRN

## 2010-06-25 MED ORDER — VITAMIN D3 25 MCG (1000 UT) PO CAPS: 1.0000 | ORAL_CAPSULE | Freq: Every day | ORAL | Status: DC

## 2010-06-25 MED ORDER — LOSARTAN POTASSIUM 50 MG PO TABS: 50.0000 mg | ORAL_TABLET | Freq: Every day | ORAL | Status: DC

## 2010-06-25 NOTE — Assessment & Plan Note (Signed)
Ativan prn

## 2010-06-25 NOTE — Patient Instructions (Signed)
Normal BP<130/85 

## 2010-06-25 NOTE — Progress Notes (Signed)
  Subjective:    [name], male    DOB: 1955/09/17, 55 y.o.   MRN: [id number]  HPI    Review of Systems     Objective:   Physical Exam      Lab Results  Component Value Date   WBC 5.6 06/25/2010   HGB 14.4 06/25/2010   HCT 42.9 06/25/2010   PLT 193.0 06/25/2010   CHOL 150 06/25/2010   TRIG 51.0 06/25/2010   HDL 43.00 06/25/2010   ALT 19 06/25/2010   AST 17 06/25/2010   NA 141 06/25/2010   K 4.2 06/25/2010   CL 105 06/25/2010   CREATININE 1.0 06/25/2010   BUN 16 06/25/2010   CO2 28 06/25/2010   TSH 1.15 06/25/2010   PSA 0.18 06/25/2010     Assessment & Plan:  Wellness  We discussed age appropriate health related issues, including available/recomended screening tests and vaccinations. We discussed a need for adhering to healthy diet and exercise. Labs/EKG were reviewed/ordered. All questions were answered.

## 2010-06-25 NOTE — Telephone Encounter (Signed)
Stacey, please, inform patient that all labs are normal Thx 

## 2010-06-25 NOTE — Progress Notes (Signed)
  Subjective:    [name], male    DOB: March 19, 1955, 55 y.o.   MRN: [id number]  HPI   The patient is here for a wellness exam. The patient has been doing well overall without major physical or psychological issues going on lately. C/o occasional panicy feeling on the road - rare C/o occasional small HA.   Review of Systems  Constitutional: Negative for appetite change, fatigue and unexpected weight change.  HENT: Positive for tinnitus. Negative for nosebleeds, congestion, sore throat, sneezing, trouble swallowing and neck pain.   Eyes: Negative for itching and visual disturbance.  Respiratory: Negative for cough.   Cardiovascular: Negative for chest pain, palpitations and leg swelling.  Gastrointestinal: Negative for nausea, diarrhea, blood in stool and abdominal distention.  Genitourinary: Negative for frequency and hematuria.  Musculoskeletal: Negative for back pain, joint swelling and gait problem.  Skin: Negative for rash.  Neurological: Negative for dizziness, tremors, speech difficulty and weakness.  Psychiatric/Behavioral: Negative for sleep disturbance, dysphoric mood and agitation. The patient is nervous/anxious (sometimes).        Objective:   Physical Exam  Constitutional: He is oriented to person, place, and time. He appears well-developed and well-nourished. No distress.  HENT:  Head: Normocephalic and atraumatic.  Right Ear: External ear normal.  Left Ear: External ear normal.  Nose: Nose normal.  Mouth/Throat: Oropharynx is clear and moist. No oropharyngeal exudate.  Eyes: Conjunctivae and EOM are normal. Pupils are equal, round, and reactive to light. Right eye exhibits no discharge. Left eye exhibits no discharge. No scleral icterus.  Neck: Normal range of motion. Neck supple. No JVD present. No tracheal deviation present. No thyromegaly present.  Cardiovascular: Normal rate, regular rhythm, normal heart sounds and intact distal pulses.  Exam reveals no  gallop and no friction rub.   No murmur heard. Pulmonary/Chest: Effort normal and breath sounds normal. No stridor. No respiratory distress. He has no wheezes. He has no rales. He exhibits no tenderness.  Abdominal: Soft. Bowel sounds are normal. He exhibits no distension and no mass. There is no tenderness. There is no rebound and no guarding.  Genitourinary: Rectum normal, prostate normal and penis normal. Guaiac negative stool. No penile tenderness.  Musculoskeletal: Normal range of motion. He exhibits no edema and no tenderness.  Lymphadenopathy:    He has no cervical adenopathy.  Neurological: He is alert and oriented to person, place, and time. He has normal reflexes. No cranial nerve deficit. He exhibits normal muscle tone. Coordination normal.  Skin: Skin is warm and dry. No rash noted. He is not diaphoretic. No erythema. No pallor.  Psychiatric: He has a normal mood and affect. His behavior is normal. Judgment and thought content normal.   BP Readings from Last 3 Encounters:  06/25/10 120/90  06/15/10 118/76  09/02/09 136/82          Assessment & Plan:  Borderline BP  Paresthesia

## 2010-06-25 NOTE — Assessment & Plan Note (Signed)
On Rx 

## 2010-06-25 NOTE — Assessment & Plan Note (Signed)
We will try neurontin

## 2010-06-25 NOTE — Assessment & Plan Note (Signed)
Better now 

## 2010-06-26 NOTE — Telephone Encounter (Signed)
Pt informed

## 2011-01-01 ENCOUNTER — Ambulatory Visit: Payer: BC Managed Care – PPO | Admitting: Internal Medicine

## 2011-01-04 ENCOUNTER — Telehealth: Payer: Self-pay | Admitting: *Deleted

## 2011-01-04 NOTE — Telephone Encounter (Signed)
OK to fill this prescription with additional refills x2 Thank you!  

## 2011-01-04 NOTE — Telephone Encounter (Signed)
Rf req for Lorazepam 0.5mg  1-2 po bid prn. # 60. Ok to Rf?

## 2011-01-05 MED ORDER — LORAZEPAM 0.5 MG PO TABS: 0.5000 mg | ORAL_TABLET | Freq: Two times a day (BID) | ORAL | Status: DC

## 2011-03-01 ENCOUNTER — Encounter: Payer: Self-pay | Admitting: Internal Medicine

## 2011-03-01 ENCOUNTER — Ambulatory Visit (INDEPENDENT_AMBULATORY_CARE_PROVIDER_SITE_OTHER): Payer: BC Managed Care – PPO | Admitting: Internal Medicine

## 2011-03-01 ENCOUNTER — Ambulatory Visit (INDEPENDENT_AMBULATORY_CARE_PROVIDER_SITE_OTHER)
Admission: RE | Admit: 2011-03-01 | Discharge: 2011-03-01 | Disposition: A | Discharge status: Home / Self Care | Payer: BC Managed Care – PPO | Source: Ambulatory Visit | Attending: Internal Medicine | Admitting: Internal Medicine

## 2011-03-01 VITALS — BP 110/72 | HR 76 | Temp 98.5°F | Resp 16 | Ht 71.0 in | Wt 221.0 lb

## 2011-03-01 DIAGNOSIS — F411 Generalized anxiety disorder: Secondary | ICD-10-CM

## 2011-03-01 DIAGNOSIS — M25519 Pain in unspecified shoulder: Secondary | ICD-10-CM

## 2011-03-01 DIAGNOSIS — H9313 Tinnitus, bilateral: Secondary | ICD-10-CM | POA: Insufficient documentation

## 2011-03-01 DIAGNOSIS — H9319 Tinnitus, unspecified ear: Secondary | ICD-10-CM | POA: Insufficient documentation

## 2011-03-01 DIAGNOSIS — R209 Unspecified disturbances of skin sensation: Secondary | ICD-10-CM

## 2011-03-01 DIAGNOSIS — J45909 Unspecified asthma, uncomplicated: Secondary | ICD-10-CM

## 2011-03-01 DIAGNOSIS — R202 Paresthesia of skin: Secondary | ICD-10-CM

## 2011-03-01 MED ORDER — LORATADINE 10 MG PO TABS: 10.0000 mg | ORAL_TABLET | Freq: Every day | ORAL | Status: DC

## 2011-03-01 MED ORDER — PANTOPRAZOLE SODIUM 40 MG PO TBEC: 40.0000 mg | DELAYED_RELEASE_TABLET | Freq: Every day | ORAL | Status: DC

## 2011-03-01 MED ORDER — IBUPROFEN 600 MG PO TABS: ORAL_TABLET | ORAL | Status: DC

## 2011-03-01 MED ORDER — LOSARTAN POTASSIUM 50 MG PO TABS: 50.0000 mg | ORAL_TABLET | Freq: Every day | ORAL | Status: DC

## 2011-03-01 MED ORDER — LORAZEPAM 0.5 MG PO TABS: 0.5000 mg | ORAL_TABLET | Freq: Two times a day (BID) | ORAL | Status: DC

## 2011-03-01 MED ORDER — VITAMIN D3 25 MCG (1000 UT) PO CAPS: 1.0000 | ORAL_CAPSULE | Freq: Every day | ORAL | Status: DC

## 2011-03-01 MED ORDER — ALBUTEROL SULFATE HFA 108 (90 BASE) MCG/ACT IN AERS: 2.0000 | INHALATION_SPRAY | Freq: Four times a day (QID) | RESPIRATORY_TRACT | Status: DC | PRN

## 2011-03-01 MED ORDER — GABAPENTIN 100 MG PO CAPS: 100.0000 mg | ORAL_CAPSULE | Freq: Three times a day (TID) | ORAL | Status: DC | PRN

## 2011-03-01 NOTE — Progress Notes (Signed)
  Subjective:    [name], male    DOB: 31-Jul-1955, 56 y.o.   MRN: [id number]  HPI  C/o L post knee pain episode 1 mo ago, resolved. There was a rope-like swelling there too (2"), resolved C/o R thumb numbness at times - worse w/laptop use R arm pain  Review of Systems  Constitutional: Negative for appetite change, fatigue and unexpected weight change.  HENT: Negative for nosebleeds, congestion, sore throat, sneezing, trouble swallowing and neck pain.   Eyes: Negative for itching and visual disturbance.  Respiratory: Negative for cough.   Cardiovascular: Negative for chest pain, palpitations and leg swelling.  Gastrointestinal: Negative for nausea, diarrhea, blood in stool and abdominal distention.  Genitourinary: Negative for frequency and hematuria.  Musculoskeletal: Positive for arthralgias. Negative for back pain, joint swelling and gait problem.  Skin: Negative for rash.  Neurological: Negative for dizziness, tremors, speech difficulty and weakness.  Psychiatric/Behavioral: Negative for suicidal ideas, sleep disturbance, dysphoric mood and agitation. The patient is not nervous/anxious.        Objective:   Physical Exam  Constitutional: He is oriented to person, place, and time. He appears well-developed.  HENT:  Mouth/Throat: Oropharynx is clear and moist.  Eyes: Conjunctivae are normal. Pupils are equal, round, and reactive to light.  Neck: Normal range of motion. No JVD present. No thyromegaly present.  Cardiovascular: Normal rate, regular rhythm, normal heart sounds and intact distal pulses.  Exam reveals no gallop and no friction rub.   No murmur heard. Pulmonary/Chest: Effort normal and breath sounds normal. No respiratory distress. He has no wheezes. He has no rales. He exhibits no tenderness.  Abdominal: Soft. Bowel sounds are normal. He exhibits no distension and no mass. There is no tenderness. There is no rebound and no guarding.  Musculoskeletal: Normal  range of motion. He exhibits tenderness (R trap is sensitive). He exhibits no edema.  Lymphadenopathy:    He has no cervical adenopathy.  Neurological: He is alert and oriented to person, place, and time. He has normal reflexes. No cranial nerve deficit. He exhibits normal muscle tone. Coordination normal.  Skin: Skin is warm and dry. No rash noted.  Psychiatric: He has a normal mood and affect. His behavior is normal. Judgment and thought content normal.     Lab Results  Component Value Date   WBC 5.6 06/25/2010   HGB 14.4 06/25/2010   HCT 42.9 06/25/2010   PLT 193.0 06/25/2010   GLUCOSE 74 06/25/2010   CHOL 150 06/25/2010   TRIG 51.0 06/25/2010   HDL 43.00 06/25/2010   LDLCALC 97 06/25/2010   ALT 19 06/25/2010   AST 17 06/25/2010   NA 141 06/25/2010   K 4.2 06/25/2010   CL 105 06/25/2010   CREATININE 1.0 06/25/2010   BUN 16 06/25/2010   CO2 28 06/25/2010   TSH 1.15 06/25/2010   PSA 0.18 06/25/2010        Assessment & Plan:

## 2011-03-02 ENCOUNTER — Telehealth: Payer: Self-pay | Admitting: *Deleted

## 2011-03-02 ENCOUNTER — Encounter: Payer: Self-pay | Admitting: Internal Medicine

## 2011-03-02 DIAGNOSIS — Z0000 Encounter for general adult medical examination without abnormal findings: Secondary | ICD-10-CM

## 2011-03-02 DIAGNOSIS — Z0389 Encounter for observation for other suspected diseases and conditions ruled out: Secondary | ICD-10-CM

## 2011-03-02 NOTE — Assessment & Plan Note (Signed)
Continue with current prescription therapy as reflected on the Med list.  

## 2011-03-02 NOTE — Assessment & Plan Note (Signed)
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

## 2011-03-02 NOTE — Assessment & Plan Note (Signed)
C spine x ray Ergonomics w/laptop work needs to be improved See Meds

## 2011-03-02 NOTE — Telephone Encounter (Signed)
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

## 2011-03-02 NOTE — Telephone Encounter (Signed)
Left mess for patient to call back.  

## 2011-03-04 NOTE — Telephone Encounter (Signed)
Pt informed

## 2011-06-02 ENCOUNTER — Other Ambulatory Visit: Payer: Self-pay | Admitting: Internal Medicine

## 2011-06-28 ENCOUNTER — Ambulatory Visit (INDEPENDENT_AMBULATORY_CARE_PROVIDER_SITE_OTHER): Payer: BC Managed Care – PPO | Admitting: Internal Medicine

## 2011-06-28 ENCOUNTER — Encounter: Payer: Self-pay | Admitting: Internal Medicine

## 2011-06-28 ENCOUNTER — Other Ambulatory Visit (INDEPENDENT_AMBULATORY_CARE_PROVIDER_SITE_OTHER): Payer: BC Managed Care – PPO

## 2011-06-28 VITALS — BP 130/88 | HR 68 | Temp 98.7°F | Wt 221.0 lb

## 2011-06-28 DIAGNOSIS — Z0000 Encounter for general adult medical examination without abnormal findings: Secondary | ICD-10-CM

## 2011-06-28 DIAGNOSIS — H9319 Tinnitus, unspecified ear: Secondary | ICD-10-CM

## 2011-06-28 DIAGNOSIS — J45909 Unspecified asthma, uncomplicated: Secondary | ICD-10-CM

## 2011-06-28 DIAGNOSIS — M79602 Pain in left arm: Secondary | ICD-10-CM | POA: Insufficient documentation

## 2011-06-28 DIAGNOSIS — F411 Generalized anxiety disorder: Secondary | ICD-10-CM

## 2011-06-28 DIAGNOSIS — M545 Low back pain: Secondary | ICD-10-CM

## 2011-06-28 DIAGNOSIS — M79609 Pain in unspecified limb: Secondary | ICD-10-CM

## 2011-06-28 LAB — CBC WITH DIFFERENTIAL/PLATELET
Basophils Absolute: 0 10*3/uL (ref 0.0–0.1)
HCT: 43.8 % (ref 39.0–52.0)
Hemoglobin: 14.4 g/dL (ref 13.0–17.0)
Lymphs Abs: 1.4 10*3/uL (ref 0.7–4.0)
MCV: 92.3 fl (ref 78.0–100.0)
Monocytes Absolute: 0.4 10*3/uL (ref 0.1–1.0)
Monocytes Relative: 8.1 % (ref 3.0–12.0)
Neutro Abs: 2.4 10*3/uL (ref 1.4–7.7)
Platelets: 168 10*3/uL (ref 150.0–400.0)
RDW: 13.4 % (ref 11.5–14.6)

## 2011-06-28 LAB — URINALYSIS
Nitrite: NEGATIVE
Total Protein, Urine: NEGATIVE
Urine Glucose: NEGATIVE
pH: 7 (ref 5.0–8.0)

## 2011-06-28 LAB — BASIC METABOLIC PANEL
BUN: 10 mg/dL (ref 6–23)
Calcium: 8.7 mg/dL (ref 8.4–10.5)
Creatinine, Ser: 1.3 mg/dL (ref 0.4–1.5)
GFR: 62.4 mL/min (ref >60.00)
Glucose, Bld: 91 mg/dL (ref 70–99)
Sodium: 143 mEq/L (ref 135–145)

## 2011-06-28 LAB — HEPATIC FUNCTION PANEL
Alkaline Phosphatase: 43 U/L (ref 39–117)
Bilirubin, Direct: 0.1 mg/dL (ref 0.0–0.3)
Total Bilirubin: 0.6 mg/dL (ref 0.3–1.2)
Total Protein: 7 g/dL (ref 6.0–8.3)

## 2011-06-28 LAB — LIPID PANEL
Cholesterol: 157 mg/dL (ref 0–200)
LDL Cholesterol: 99 mg/dL (ref 0–99)
VLDL: 21 mg/dL (ref 0.0–40.0)

## 2011-06-28 LAB — PSA: PSA: 0.27 ng/mL (ref 0.10–4.00)

## 2011-06-28 MED ORDER — PANTOPRAZOLE SODIUM 40 MG PO TBEC: 40.0000 mg | DELAYED_RELEASE_TABLET | Freq: Every day | ORAL | Status: DC

## 2011-06-28 MED ORDER — LORATADINE 10 MG PO TABS: 10.0000 mg | ORAL_TABLET | Freq: Every day | ORAL | Status: DC

## 2011-06-28 MED ORDER — LORAZEPAM 0.5 MG PO TABS: 0.5000 mg | ORAL_TABLET | Freq: Two times a day (BID) | ORAL | Status: DC

## 2011-06-28 MED ORDER — DICLOFENAC SODIUM 1.5 % TD SOLN: 5.0000 [drp] | Freq: Four times a day (QID) | TRANSDERMAL | Status: DC | PRN

## 2011-06-28 MED ORDER — ALBUTEROL SULFATE HFA 108 (90 BASE) MCG/ACT IN AERS: 2.0000 | INHALATION_SPRAY | Freq: Four times a day (QID) | RESPIRATORY_TRACT | Status: DC | PRN

## 2011-06-28 MED ORDER — GABAPENTIN 100 MG PO CAPS: 100.0000 mg | ORAL_CAPSULE | Freq: Three times a day (TID) | ORAL | Status: DC | PRN

## 2011-06-28 NOTE — Assessment & Plan Note (Signed)
Doing well 

## 2011-06-28 NOTE — Assessment & Plan Note (Signed)
Continue with current prescription therapy as reflected on the Med list.  

## 2011-06-28 NOTE — Patient Instructions (Signed)
BP Readings from Last 3 Encounters:  06/28/11 130/88  03/01/11 110/72  06/25/10 120/90

## 2011-06-28 NOTE — Assessment & Plan Note (Addendum)
We discussed age appropriate health related issues, including available/recomended screening tests and vaccinations. We discussed a need for adhering to healthy diet and exercise. Labs/EKG were reviewed/ordered. All questions were answered. Declined colonoscopy, shots 

## 2011-06-28 NOTE — Assessment & Plan Note (Signed)
Try gabapentin

## 2011-06-28 NOTE — Assessment & Plan Note (Signed)
L wrist extensors tendonitis  Pennsaid rx

## 2011-06-28 NOTE — Progress Notes (Signed)
[name], male   DOB: [birth year], 56 y.o.   MRN: [id number]  Subjective:    [name], male    DOB: 1955/05/19, 56 y.o.   MRN: [id number]  HPI   The patient is here for a wellness exam. The patient has been doing well overall. C/o occasional L forearm pain; rare anxiety; tinnitus; elev BP -- not taking meds   BP Readings from Last 3 Encounters:  06/28/11 130/88  03/01/11 110/72  06/25/10 120/90   Wt Readings from Last 3 Encounters:  06/28/11 221 lb (100.245 kg)  03/01/11 221 lb (100.245 kg)  06/25/10 206 lb (93.441 kg)      Review of Systems  Constitutional: Negative for appetite change, fatigue and unexpected weight change.  HENT: Positive for tinnitus. Negative for nosebleeds, congestion, sore throat, sneezing, trouble swallowing and neck pain.   Eyes: Negative for itching and visual disturbance.  Respiratory: Negative for cough.   Cardiovascular: Negative for chest pain, palpitations and leg swelling.  Gastrointestinal: Negative for nausea, diarrhea, blood in stool and abdominal distention.  Genitourinary: Negative for frequency and hematuria.  Musculoskeletal: Negative for back pain, joint swelling and gait problem.  Skin: Negative for rash.  Neurological: Negative for dizziness, tremors, speech difficulty and weakness.  Psychiatric/Behavioral: Negative for sleep disturbance, dysphoric mood and agitation. The patient is nervous/anxious (sometimes).        Objective:   Physical Exam  Constitutional: He is oriented to person, place, and time. He appears well-developed and well-nourished. No distress.  HENT:  Head: Normocephalic and atraumatic.  Right Ear: External ear normal.  Left Ear: External ear normal.  Nose: Nose normal.  Mouth/Throat: Oropharynx is clear and moist. No oropharyngeal exudate.  Eyes: Conjunctivae and EOM are normal. Pupils are equal, round, and reactive to light. Right eye exhibits no discharge. Left eye exhibits no  discharge. No scleral icterus.  Neck: Normal range of motion. Neck supple. No JVD present. No tracheal deviation present. No thyromegaly present.  Cardiovascular: Normal rate, regular rhythm, normal heart sounds and intact distal pulses.  Exam reveals no gallop and no friction rub.   No murmur heard. Pulmonary/Chest: Effort normal and breath sounds normal. No stridor. No respiratory distress. He has no wheezes. He has no rales. He exhibits no tenderness.  Abdominal: Soft. Bowel sounds are normal. He exhibits no distension and no mass. There is no tenderness. There is no rebound and no guarding.  Genitourinary: Rectum normal, prostate normal and penis normal. Guaiac negative stool. No penile tenderness.  Musculoskeletal: Normal range of motion. He exhibits no edema and no tenderness.  Lymphadenopathy:    He has no cervical adenopathy.  Neurological: He is alert and oriented to person, place, and time. He has normal reflexes. No cranial nerve deficit. He exhibits normal muscle tone. Coordination normal.  Skin: Skin is warm and dry. No rash noted. He is not diaphoretic. No erythema. No pallor.  Psychiatric: He has a normal mood and affect. His behavior is normal. Judgment and thought content normal.  L forearm is tender over wrist extensors Lab Results  Component Value Date   WBC 5.6 06/25/2010   HGB 14.4 06/25/2010   HCT 42.9 06/25/2010   PLT 193.0 06/25/2010   GLUCOSE 74 06/25/2010   CHOL 150 06/25/2010   TRIG 51.0 06/25/2010   HDL 43.00 06/25/2010   LDLCALC 97 06/25/2010   ALT 19 06/25/2010   AST 17 06/25/2010   NA 141 06/25/2010   K 4.2 06/25/2010  CL 105 06/25/2010   CREATININE 1.0 06/25/2010   BUN 16 06/25/2010   CO2 28 06/25/2010   TSH 1.15 06/25/2010   PSA 0.18 06/25/2010           Assessment & Plan:

## 2012-01-15 ENCOUNTER — Other Ambulatory Visit: Payer: Self-pay | Admitting: Internal Medicine

## 2012-01-17 NOTE — Telephone Encounter (Signed)
Ok to Rf? 

## 2012-01-31 ENCOUNTER — Ambulatory Visit (INDEPENDENT_AMBULATORY_CARE_PROVIDER_SITE_OTHER): Payer: BC Managed Care – PPO | Admitting: Internal Medicine

## 2012-01-31 ENCOUNTER — Encounter: Payer: Self-pay | Admitting: Internal Medicine

## 2012-01-31 VITALS — BP 110/68 | HR 72 | Temp 98.4°F | Resp 16 | Wt 226.0 lb

## 2012-01-31 DIAGNOSIS — R42 Dizziness and giddiness: Secondary | ICD-10-CM

## 2012-01-31 DIAGNOSIS — Z23 Encounter for immunization: Secondary | ICD-10-CM

## 2012-01-31 DIAGNOSIS — F411 Generalized anxiety disorder: Secondary | ICD-10-CM

## 2012-01-31 MED ORDER — GABAPENTIN 100 MG PO CAPS: 100.0000 mg | ORAL_CAPSULE | Freq: Three times a day (TID) | ORAL | Status: DC | PRN

## 2012-01-31 MED ORDER — LORAZEPAM 0.5 MG PO TABS: 0.5000 mg | ORAL_TABLET | Freq: Two times a day (BID) | ORAL | Status: DC

## 2012-01-31 NOTE — Progress Notes (Signed)
   Subjective:    [name], male    DOB: [birth year], 56 y.o.   MRN: [id number]  HPI  F/u L post knee pain episode 1 mo ago, resolved. There was a rope-like swelling there too (2"), resolved F/u R thumb numbness at times - better F/u tinnitus and anxiety  Review of Systems  Constitutional: Negative for appetite change, fatigue and unexpected weight change.  HENT: Negative for nosebleeds, congestion, sore throat, sneezing, trouble swallowing and neck pain.   Eyes: Negative for itching and visual disturbance.  Respiratory: Negative for cough.   Cardiovascular: Negative for chest pain, palpitations and leg swelling.  Gastrointestinal: Negative for nausea, diarrhea, blood in stool and abdominal distention.  Genitourinary: Negative for frequency and hematuria.  Musculoskeletal: Positive for arthralgias. Negative for back pain, joint swelling and gait problem.  Skin: Negative for rash.  Neurological: Negative for dizziness, tremors, speech difficulty and weakness.  Psychiatric/Behavioral: Negative for suicidal ideas, sleep disturbance, dysphoric mood and agitation. The patient is not nervous/anxious.        Objective:   Physical Exam  Constitutional: He is oriented to person, place, and time. He appears well-developed.  HENT:  Mouth/Throat: Oropharynx is clear and moist.  Eyes: Conjunctivae normal are normal. Pupils are equal, round, and reactive to light.  Neck: Normal range of motion. No JVD present. No thyromegaly present.  Cardiovascular: Normal rate, regular rhythm, normal heart sounds and intact distal pulses.  Exam reveals no gallop and no friction rub.   No murmur heard. Pulmonary/Chest: Effort normal and breath sounds normal. No respiratory distress. He has no wheezes. He has no rales. He exhibits no tenderness.  Abdominal: Soft. Bowel sounds are normal. He exhibits no distension and no mass. There is no tenderness. There is no rebound and no guarding.   Musculoskeletal: Normal range of motion. He exhibits tenderness (R trap is sensitive). He exhibits no edema.  Lymphadenopathy:    He has no cervical adenopathy.  Neurological: He is alert and oriented to person, place, and time. He has normal reflexes. No cranial nerve deficit. He exhibits normal muscle tone. Coordination normal.  Skin: Skin is warm and dry. No rash noted.  Psychiatric: He has a normal mood and affect. His behavior is normal. Judgment and thought content normal.     Lab Results  Component Value Date   WBC 4.4* 06/28/2011   HGB 14.4 06/28/2011   HCT 43.8 06/28/2011   PLT 168.0 06/28/2011   GLUCOSE 91 06/28/2011   CHOL 157 06/28/2011   TRIG 105.0 06/28/2011   HDL 37.50* 06/28/2011   LDLCALC 99 06/28/2011   ALT 20 06/28/2011   AST 20 06/28/2011   NA 143 06/28/2011   K 4.0 06/28/2011   CL 103 06/28/2011   CREATININE 1.3 06/28/2011   BUN 10 06/28/2011   CO2 24 06/28/2011   TSH 0.97 06/28/2011   PSA 0.27 06/28/2011        Assessment & Plan:

## 2012-02-09 NOTE — Assessment & Plan Note (Signed)
ENT cons was offered

## 2012-02-09 NOTE — Assessment & Plan Note (Signed)
Continue with current prescription therapy as reflected on the Med list.  

## 2012-02-29 ENCOUNTER — Telehealth: Payer: Self-pay | Admitting: *Deleted

## 2012-02-29 DIAGNOSIS — Z0389 Encounter for observation for other suspected diseases and conditions ruled out: Secondary | ICD-10-CM

## 2012-02-29 DIAGNOSIS — Z0000 Encounter for general adult medical examination without abnormal findings: Secondary | ICD-10-CM

## 2012-02-29 NOTE — Telephone Encounter (Signed)
Message copied by Merrilyn Puma on Tue Feb 29, 2012 10:42 AM ------      Message from: Etheleen Sia      Created: Mon Jan 31, 2012  8:22 AM      Regarding: LAB       PHYSICAL LABS IN END OF MAY

## 2012-02-29 NOTE — Telephone Encounter (Signed)
Done

## 2012-07-19 ENCOUNTER — Encounter: Payer: BC Managed Care – PPO | Admitting: Internal Medicine

## 2012-08-02 ENCOUNTER — Encounter: Payer: Self-pay | Admitting: Internal Medicine

## 2012-08-02 ENCOUNTER — Other Ambulatory Visit (INDEPENDENT_AMBULATORY_CARE_PROVIDER_SITE_OTHER): Payer: BC Managed Care – PPO

## 2012-08-02 ENCOUNTER — Ambulatory Visit (INDEPENDENT_AMBULATORY_CARE_PROVIDER_SITE_OTHER): Payer: BC Managed Care – PPO | Admitting: Internal Medicine

## 2012-08-02 VITALS — BP 122/80 | HR 80 | Temp 98.4°F | Resp 16 | Ht 71.0 in | Wt 225.0 lb

## 2012-08-02 DIAGNOSIS — Z0000 Encounter for general adult medical examination without abnormal findings: Secondary | ICD-10-CM

## 2012-08-02 DIAGNOSIS — E785 Hyperlipidemia, unspecified: Secondary | ICD-10-CM

## 2012-08-02 DIAGNOSIS — M25519 Pain in unspecified shoulder: Secondary | ICD-10-CM

## 2012-08-02 DIAGNOSIS — Z0389 Encounter for observation for other suspected diseases and conditions ruled out: Secondary | ICD-10-CM

## 2012-08-02 LAB — HEPATIC FUNCTION PANEL
ALT: 19 U/L (ref 0–53)
AST: 15 U/L (ref 0–37)
Alkaline Phosphatase: 60 U/L (ref 39–117)
Bilirubin, Direct: 0 mg/dL (ref 0.0–0.3)
Total Bilirubin: 0.4 mg/dL (ref 0.3–1.2)

## 2012-08-02 LAB — BASIC METABOLIC PANEL
BUN: 17 mg/dL (ref 6–23)
Calcium: 8.6 mg/dL (ref 8.4–10.5)
Creatinine, Ser: 1.2 mg/dL (ref 0.4–1.5)
GFR: 65.14 mL/min (ref >60.00)
Glucose, Bld: 127 mg/dL — ABNORMAL HIGH (ref 70–99)
Potassium: 3.6 mEq/L (ref 3.5–5.1)

## 2012-08-02 LAB — CBC WITH DIFFERENTIAL/PLATELET
Basophils Relative: 0.5 % (ref 0.0–3.0)
Eosinophils Absolute: 0.1 10*3/uL (ref 0.0–0.7)
Eosinophils Relative: 2.6 % (ref 0.0–5.0)
HCT: 42.1 % (ref 39.0–52.0)
Hemoglobin: 13.5 g/dL (ref 13.0–17.0)
Lymphs Abs: 1.6 10*3/uL (ref 0.7–4.0)
MCHC: 32.2 g/dL (ref 30.0–36.0)
MCV: 92.6 fl (ref 78.0–100.0)
Monocytes Absolute: 0.4 10*3/uL (ref 0.1–1.0)
Neutro Abs: 3.1 10*3/uL (ref 1.4–7.7)
Neutrophils Relative %: 58.4 % (ref 43.0–77.0)
RBC: 4.54 Mil/uL (ref 4.22–5.81)
WBC: 5.3 10*3/uL (ref 4.5–10.5)

## 2012-08-02 LAB — URINALYSIS
Bilirubin Urine: NEGATIVE
Ketones, ur: NEGATIVE
Total Protein, Urine: NEGATIVE
Urine Glucose: NEGATIVE

## 2012-08-02 MED ORDER — LORAZEPAM 0.5 MG PO TABS: 0.5000 mg | ORAL_TABLET | Freq: Two times a day (BID) | ORAL | Status: DC

## 2012-08-02 MED ORDER — ALBUTEROL SULFATE HFA 108 (90 BASE) MCG/ACT IN AERS: 2.0000 | INHALATION_SPRAY | Freq: Four times a day (QID) | RESPIRATORY_TRACT | Status: DC | PRN

## 2012-08-02 MED ORDER — SILDENAFIL CITRATE 100 MG PO TABS: 100.0000 mg | ORAL_TABLET | ORAL | Status: DC | PRN

## 2012-08-02 MED ORDER — DICLOFENAC SODIUM 1.5 % TD SOLN: 5.0000 [drp] | Freq: Four times a day (QID) | TRANSDERMAL | Status: DC | PRN

## 2012-08-02 MED ORDER — IBUPROFEN 600 MG PO TABS: 600.0000 mg | ORAL_TABLET | Freq: Three times a day (TID) | ORAL | Status: DC | PRN

## 2012-08-02 MED ORDER — TRAMADOL HCL 50 MG PO TABS: 50.0000 mg | ORAL_TABLET | Freq: Two times a day (BID) | ORAL | Status: DC | PRN

## 2012-08-02 MED ORDER — LORATADINE 10 MG PO TABS: 10.0000 mg | ORAL_TABLET | Freq: Every day | ORAL | Status: DC

## 2012-08-02 MED ORDER — GABAPENTIN 100 MG PO CAPS: 100.0000 mg | ORAL_CAPSULE | Freq: Three times a day (TID) | ORAL | Status: DC | PRN

## 2012-08-02 NOTE — Progress Notes (Signed)
[name], male   DOB: 1956-01-27, 57 y.o.   MRN: [id number]

## 2012-08-02 NOTE — Assessment & Plan Note (Signed)
We discussed age appropriate health related issues, including available/recomended screening tests and vaccinations. We discussed a need for adhering to healthy diet and exercise. Labs/EKG were reviewed/ordered. All questions were answered. Declined colonoscopy, shots

## 2012-08-03 LAB — NMR LIPOPROFILE WITHOUT LIPIDS
HDL Size: 9 nm — ABNORMAL LOW (ref >=9.2)
Large VLDL-P: 13.4 nmol/L — ABNORMAL HIGH (ref <=2.7)
Small LDL Particle Number: 1010 nmol/L — ABNORMAL HIGH (ref <=527)
VLDL Size: 67.2 nm — ABNORMAL HIGH (ref <=46.6)

## 2012-08-04 DIAGNOSIS — E785 Hyperlipidemia, unspecified: Secondary | ICD-10-CM | POA: Insufficient documentation

## 2012-08-04 NOTE — Progress Notes (Signed)
Subjective:    [name], male    DOB: [birth year], 57 y.o.   MRN: [id number]  HPI  The patient is here for a wellness exam. The patient has been doing well overall without major physical or psychological issues going on lately, except for some aches and pains. C/o B shoulders pain w/ROM  BP Readings from Last 3 Encounters:  08/02/12 122/80  01/31/12 110/68  06/28/11 130/88   Wt Readings from Last 3 Encounters:  08/02/12 225 lb (102.059 kg)  01/31/12 226 lb (102.513 kg)  06/28/11 221 lb (100.245 kg)      Review of Systems  Constitutional: Negative for appetite change, fatigue and unexpected weight change.  HENT: Negative for ear pain, nosebleeds, congestion, sore throat, sneezing, trouble swallowing and neck pain.   Eyes: Negative for pain, itching and visual disturbance.  Respiratory: Negative for apnea, cough and chest tightness.   Cardiovascular: Negative for chest pain, palpitations and leg swelling.  Gastrointestinal: Negative for nausea, abdominal pain, diarrhea, blood in stool, abdominal distention and rectal pain.  Genitourinary: Negative for dysuria, urgency, frequency and hematuria.  Musculoskeletal: Positive for arthralgias. Negative for myalgias, back pain, joint swelling and gait problem.  Skin: Negative for rash and wound.  Neurological: Negative for dizziness, tremors, speech difficulty and weakness.  Psychiatric/Behavioral: Negative for suicidal ideas, sleep disturbance, dysphoric mood and agitation. The patient is not nervous/anxious.        Objective:   Physical Exam  Constitutional: He is oriented to person, place, and time. He appears well-developed and well-nourished. No distress.  HENT:  Head: Normocephalic and atraumatic.  Right Ear: External ear normal.  Left Ear: External ear normal.  Nose: Nose normal.  Mouth/Throat: Oropharynx is clear and moist. No oropharyngeal exudate.  Eyes: Conjunctivae and EOM are normal. Pupils are equal,  round, and reactive to light. Right eye exhibits no discharge. Left eye exhibits no discharge. No scleral icterus.  Neck: Normal range of motion. Neck supple. No JVD present. No tracheal deviation present. No thyromegaly present.  Cardiovascular: Normal rate, regular rhythm, normal heart sounds and intact distal pulses.  Exam reveals no gallop and no friction rub.   No murmur heard. Pulmonary/Chest: Effort normal and breath sounds normal. No stridor. No respiratory distress. He has no wheezes. He has no rales. He exhibits no tenderness.  Abdominal: Soft. Bowel sounds are normal. He exhibits no distension and no mass. There is no tenderness. There is no rebound and no guarding.  Genitourinary: Rectum normal, prostate normal and penis normal. Guaiac negative stool. No penile tenderness.  Musculoskeletal: Normal range of motion. He exhibits tenderness. He exhibits no edema.  B shoulders are tender w/ROM  Lymphadenopathy:    He has no cervical adenopathy.  Neurological: He is alert and oriented to person, place, and time. He has normal reflexes. No cranial nerve deficit. He exhibits normal muscle tone. Coordination normal.  Skin: Skin is warm and dry. No rash noted. He is not diaphoretic. No erythema. No pallor.  Psychiatric: He has a normal mood and affect. His behavior is normal. Judgment and thought content normal.    Lab Results  Component Value Date   WBC 5.3 08/02/2012   HGB 13.5 08/02/2012   HCT 42.1 08/02/2012   PLT 176.0 08/02/2012   GLUCOSE 127* 08/02/2012   CHOL 157 06/28/2011   TRIG 105.0 06/28/2011   HDL 37.50* 06/28/2011   LDLCALC 99 06/28/2011   ALT 19 08/02/2012   AST 15 08/02/2012   NA 135 08/02/2012  K 3.6 08/02/2012   CL 100 08/02/2012   CREATININE 1.2 08/02/2012   BUN 17 08/02/2012   CO2 26 08/02/2012   TSH 0.84 08/02/2012   PSA 0.18 08/02/2012         Assessment & Plan:

## 2012-08-04 NOTE — Assessment & Plan Note (Signed)
NMR lipids

## 2012-08-04 NOTE — Assessment & Plan Note (Addendum)
Exam was more c/w rotator cuff injury B Pt declined Ortho cons Ibuprof/Tramadol prn

## 2012-08-07 ENCOUNTER — Encounter: Payer: Self-pay | Admitting: Internal Medicine

## 2012-08-10 ENCOUNTER — Telehealth: Payer: Self-pay | Admitting: Internal Medicine

## 2012-08-10 NOTE — Telephone Encounter (Signed)
Pt got his lab report on My Chart.  He thinks the LDL was high.  He wants to know if this is a problem and if he needs medicine.  He made his Dec. appt for a 6 mo fu.  Will he need a lab order to do before the appt? Pt is aware Dr. Posey Rea is out of the office and may not answer until next week.

## 2012-08-13 NOTE — Telephone Encounter (Signed)
email sent re: LDL

## 2012-08-25 ENCOUNTER — Ambulatory Visit: Payer: BC Managed Care – PPO | Admitting: Internal Medicine

## 2012-12-21 ENCOUNTER — Other Ambulatory Visit: Payer: Self-pay

## 2013-02-01 ENCOUNTER — Telehealth: Payer: Self-pay | Admitting: *Deleted

## 2013-02-01 NOTE — Telephone Encounter (Signed)
Diclofenac PA is approved from 01/01/13 until 01/31/2014.

## 2013-02-02 ENCOUNTER — Ambulatory Visit (INDEPENDENT_AMBULATORY_CARE_PROVIDER_SITE_OTHER): Payer: BC Managed Care – PPO | Admitting: Internal Medicine

## 2013-02-02 ENCOUNTER — Ambulatory Visit: Payer: BC Managed Care – PPO | Admitting: Internal Medicine

## 2013-02-02 ENCOUNTER — Encounter: Payer: Self-pay | Admitting: Internal Medicine

## 2013-02-02 VITALS — BP 128/80 | HR 76 | Temp 98.9°F | Resp 16 | Wt 231.0 lb

## 2013-02-02 DIAGNOSIS — R739 Hyperglycemia, unspecified: Secondary | ICD-10-CM

## 2013-02-02 DIAGNOSIS — M545 Low back pain, unspecified: Secondary | ICD-10-CM

## 2013-02-02 DIAGNOSIS — R7309 Other abnormal glucose: Secondary | ICD-10-CM

## 2013-02-02 DIAGNOSIS — E785 Hyperlipidemia, unspecified: Secondary | ICD-10-CM

## 2013-02-02 MED ORDER — TRAMADOL HCL 50 MG PO TABS: 50.0000 mg | ORAL_TABLET | Freq: Two times a day (BID) | ORAL | Status: DC | PRN

## 2013-02-02 MED ORDER — LORAZEPAM 0.5 MG PO TABS: 0.5000 mg | ORAL_TABLET | Freq: Two times a day (BID) | ORAL | Status: DC

## 2013-02-02 MED ORDER — ALBUTEROL SULFATE HFA 108 (90 BASE) MCG/ACT IN AERS: 2.0000 | INHALATION_SPRAY | Freq: Four times a day (QID) | RESPIRATORY_TRACT | Status: DC | PRN

## 2013-02-02 MED ORDER — ATORVASTATIN CALCIUM 10 MG PO TABS: 10.0000 mg | ORAL_TABLET | Freq: Every day | ORAL | Status: DC

## 2013-02-02 NOTE — Progress Notes (Signed)
Pre visit review using our clinic review tool, if applicable. No additional management support is needed unless otherwise documented below in the visit note. 

## 2013-02-02 NOTE — Assessment & Plan Note (Signed)
Start Lipitor 10 mg/d

## 2013-02-02 NOTE — Assessment & Plan Note (Signed)
Loose wt 

## 2013-02-02 NOTE — Patient Instructions (Signed)
piriformis syndrome

## 2013-02-02 NOTE — Assessment & Plan Note (Signed)
Continue with current prescription therapy as reflected on the Med list. R piriformis syndrome

## 2013-02-02 NOTE — Progress Notes (Signed)
  Subjective:    HPI  F/u labs  BP Readings from Last 3 Encounters:  02/02/13 128/80  08/02/12 122/80  01/31/12 110/68   Wt Readings from Last 3 Encounters:  02/02/13 231 lb (104.781 kg)  08/02/12 225 lb (102.059 kg)  01/31/12 226 lb (102.513 kg)      Review of Systems  Constitutional: Negative for appetite change, fatigue and unexpected weight change.  HENT: Negative for congestion, ear pain, nosebleeds, sneezing, sore throat and trouble swallowing.   Eyes: Negative for pain, itching and visual disturbance.  Respiratory: Negative for apnea, cough and chest tightness.   Cardiovascular: Negative for chest pain, palpitations and leg swelling.  Gastrointestinal: Negative for nausea, abdominal pain, diarrhea, blood in stool, abdominal distention and rectal pain.  Genitourinary: Negative for dysuria, urgency, frequency and hematuria.  Musculoskeletal: Positive for arthralgias. Negative for back pain, gait problem, joint swelling, myalgias and neck pain.  Skin: Negative for rash and wound.  Neurological: Negative for dizziness, tremors, speech difficulty and weakness.  Psychiatric/Behavioral: Negative for suicidal ideas, sleep disturbance, dysphoric mood and agitation. The patient is not nervous/anxious.        Objective:   Physical Exam  Constitutional: He is oriented to person, place, and time. He appears well-developed and well-nourished. No distress.  HENT:  Head: Normocephalic and atraumatic.  Right Ear: External ear normal.  Left Ear: External ear normal.  Nose: Nose normal.  Mouth/Throat: Oropharynx is clear and moist. No oropharyngeal exudate.  Eyes: Conjunctivae and EOM are normal. Pupils are equal, round, and reactive to light. Right eye exhibits no discharge. Left eye exhibits no discharge. No scleral icterus.  Neck: Normal range of motion. Neck supple. No JVD present. No tracheal deviation present. No thyromegaly present.  Cardiovascular: Normal rate, regular  rhythm, normal heart sounds and intact distal pulses.  Exam reveals no gallop and no friction rub.   No murmur heard. Pulmonary/Chest: Effort normal and breath sounds normal. No stridor. No respiratory distress. He has no wheezes. He has no rales. He exhibits no tenderness.  Abdominal: Soft. Bowel sounds are normal. He exhibits no distension and no mass. There is no tenderness. There is no rebound and no guarding.  Genitourinary: Rectum normal, prostate normal and penis normal. Guaiac negative stool. No penile tenderness.  Musculoskeletal: Normal range of motion. He exhibits tenderness. He exhibits no edema.  B shoulders are tender w/ROM  Lymphadenopathy:    He has no cervical adenopathy.  Neurological: He is alert and oriented to person, place, and time. He has normal reflexes. No cranial nerve deficit. He exhibits normal muscle tone. Coordination normal.  Skin: Skin is warm and dry. No rash noted. He is not diaphoretic. No erythema. No pallor.  Psychiatric: He has a normal mood and affect. His behavior is normal. Judgment and thought content normal.    Lab Results  Component Value Date   WBC 5.3 08/02/2012   HGB 13.5 08/02/2012   HCT 42.1 08/02/2012   PLT 176.0 08/02/2012   GLUCOSE 127* 08/02/2012   CHOL 157 06/28/2011   TRIG 105.0 06/28/2011   HDL 37.50* 06/28/2011   LDLCALC 99 06/28/2011   ALT 19 08/02/2012   AST 15 08/02/2012   NA 135 08/02/2012   K 3.6 08/02/2012   CL 100 08/02/2012   CREATININE 1.2 08/02/2012   BUN 17 08/02/2012   CO2 26 08/02/2012   TSH 0.84 08/02/2012   PSA 0.18 08/02/2012         Assessment & Plan:

## 2013-04-09 ENCOUNTER — Ambulatory Visit (INDEPENDENT_AMBULATORY_CARE_PROVIDER_SITE_OTHER): Payer: BC Managed Care – PPO | Admitting: Internal Medicine

## 2013-04-09 ENCOUNTER — Other Ambulatory Visit (INDEPENDENT_AMBULATORY_CARE_PROVIDER_SITE_OTHER): Payer: BC Managed Care – PPO

## 2013-04-09 ENCOUNTER — Encounter: Payer: Self-pay | Admitting: Internal Medicine

## 2013-04-09 VITALS — BP 132/80 | HR 68 | Temp 98.4°F | Resp 16 | Wt 227.0 lb

## 2013-04-09 DIAGNOSIS — J45909 Unspecified asthma, uncomplicated: Secondary | ICD-10-CM

## 2013-04-09 DIAGNOSIS — R739 Hyperglycemia, unspecified: Secondary | ICD-10-CM

## 2013-04-09 DIAGNOSIS — M545 Low back pain, unspecified: Secondary | ICD-10-CM

## 2013-04-09 DIAGNOSIS — J449 Chronic obstructive pulmonary disease, unspecified: Secondary | ICD-10-CM

## 2013-04-09 DIAGNOSIS — R7309 Other abnormal glucose: Secondary | ICD-10-CM

## 2013-04-09 DIAGNOSIS — E785 Hyperlipidemia, unspecified: Secondary | ICD-10-CM

## 2013-04-09 LAB — HEPATIC FUNCTION PANEL
ALT: 29 U/L (ref 0–53)
AST: 23 U/L (ref 0–37)
Albumin: 4.3 g/dL (ref 3.5–5.2)
Alkaline Phosphatase: 48 U/L (ref 39–117)
BILIRUBIN DIRECT: 0.1 mg/dL (ref 0.0–0.3)
BILIRUBIN TOTAL: 0.7 mg/dL (ref 0.3–1.2)
Total Protein: 7.2 g/dL (ref 6.0–8.3)

## 2013-04-09 LAB — LIPID PANEL
CHOLESTEROL: 117 mg/dL (ref 0–200)
HDL: 36.4 mg/dL — ABNORMAL LOW (ref >39.00)
LDL Cholesterol: 67 mg/dL (ref 0–99)
TRIGLYCERIDES: 69 mg/dL (ref 0.0–149.0)
Total CHOL/HDL Ratio: 3
VLDL: 13.8 mg/dL (ref 0.0–40.0)

## 2013-04-09 LAB — BASIC METABOLIC PANEL WITH GFR
BUN: 10 mg/dL (ref 6–23)
CO2: 28 meq/L (ref 19–32)
Calcium: 9.2 mg/dL (ref 8.4–10.5)
Chloride: 107 meq/L (ref 96–112)
Creatinine, Ser: 1.3 mg/dL (ref 0.4–1.5)
GFR: 62.59 mL/min
Glucose, Bld: 90 mg/dL (ref 70–99)
Potassium: 4.6 meq/L (ref 3.5–5.1)
Sodium: 141 meq/L (ref 135–145)

## 2013-04-09 LAB — HEMOGLOBIN A1C: Hgb A1c MFr Bld: 6.1 % (ref 4.6–6.5)

## 2013-04-09 MED ORDER — LORAZEPAM 0.5 MG PO TABS: 0.5000 mg | ORAL_TABLET | Freq: Two times a day (BID) | ORAL | Status: DC

## 2013-04-09 MED ORDER — SCOPOLAMINE 1 MG/3DAYS TD PT72: 1.0000 | MEDICATED_PATCH | TRANSDERMAL | Status: DC

## 2013-04-09 MED ORDER — ALBUTEROL SULFATE HFA 108 (90 BASE) MCG/ACT IN AERS: 2.0000 | INHALATION_SPRAY | Freq: Four times a day (QID) | RESPIRATORY_TRACT | Status: DC | PRN

## 2013-04-09 NOTE — Assessment & Plan Note (Signed)
Continue with current prescription therapy as reflected on the Med list.  

## 2013-04-09 NOTE — Progress Notes (Signed)
Subjective:    HPI  F/u dyslipidemia, anxiety Going on a cruise  BP Readings from Last 3 Encounters:  04/09/13 132/80  02/02/13 128/80  08/02/12 122/80   Wt Readings from Last 3 Encounters:  04/09/13 227 lb (102.967 kg)  02/02/13 231 lb (104.781 kg)  08/02/12 225 lb (102.059 kg)      Review of Systems  Constitutional: Negative for appetite change, fatigue and unexpected weight change.  HENT: Negative for congestion, ear pain, nosebleeds, sneezing, sore throat and trouble swallowing.   Eyes: Negative for pain, itching and visual disturbance.  Respiratory: Negative for apnea, cough and chest tightness.   Cardiovascular: Negative for chest pain, palpitations and leg swelling.  Gastrointestinal: Negative for nausea, abdominal pain, diarrhea, blood in stool, abdominal distention and rectal pain.  Genitourinary: Negative for dysuria, urgency, frequency and hematuria.  Musculoskeletal: Positive for arthralgias. Negative for back pain, gait problem, joint swelling, myalgias and neck pain.  Skin: Negative for rash and wound.  Neurological: Negative for dizziness, tremors, speech difficulty and weakness.  Psychiatric/Behavioral: Negative for suicidal ideas, sleep disturbance, dysphoric mood and agitation. The patient is not nervous/anxious.        Objective:   Physical Exam  Constitutional: He is oriented to person, place, and time. He appears well-developed and well-nourished. No distress.  HENT:  Head: Normocephalic and atraumatic.  Right Ear: External ear normal.  Left Ear: External ear normal.  Nose: Nose normal.  Mouth/Throat: Oropharynx is clear and moist. No oropharyngeal exudate.  Eyes: Conjunctivae and EOM are normal. Pupils are equal, round, and reactive to light. Right eye exhibits no discharge. Left eye exhibits no discharge. No scleral icterus.  Neck: Normal range of motion. Neck supple. No JVD present. No tracheal deviation present. No thyromegaly present.   Cardiovascular: Normal rate, regular rhythm, normal heart sounds and intact distal pulses.  Exam reveals no gallop and no friction rub.   No murmur heard. Pulmonary/Chest: Effort normal and breath sounds normal. No stridor. No respiratory distress. He has no wheezes. He has no rales. He exhibits no tenderness.  Abdominal: Soft. Bowel sounds are normal. He exhibits no distension and no mass. There is no tenderness. There is no rebound and no guarding.  Genitourinary: Rectum normal, prostate normal and penis normal. Guaiac negative stool. No penile tenderness.  Musculoskeletal: Normal range of motion. He exhibits tenderness. He exhibits no edema.  B shoulders are tender w/ROM  Lymphadenopathy:    He has no cervical adenopathy.  Neurological: He is alert and oriented to person, place, and time. He has normal reflexes. No cranial nerve deficit. He exhibits normal muscle tone. Coordination normal.  Skin: Skin is warm and dry. No rash noted. He is not diaphoretic. No erythema. No pallor.  Psychiatric: He has a normal mood and affect. His behavior is normal. Judgment and thought content normal.    Lab Results  Component Value Date   WBC 5.3 08/02/2012   HGB 13.5 08/02/2012   HCT 42.1 08/02/2012   PLT 176.0 08/02/2012   GLUCOSE 127* 08/02/2012   CHOL 157 06/28/2011   TRIG 105.0 06/28/2011   HDL 37.50* 06/28/2011   LDLCALC 99 06/28/2011   ALT 19 08/02/2012   AST 15 08/02/2012   NA 135 08/02/2012   K 3.6 08/02/2012   CL 100 08/02/2012   CREATININE 1.2 08/02/2012   BUN 17 08/02/2012   CO2 26 08/02/2012   TSH 0.84 08/02/2012   PSA 0.18 08/02/2012         Assessment &  Plan:

## 2013-04-09 NOTE — Assessment & Plan Note (Signed)
Doing the same

## 2013-04-09 NOTE — Assessment & Plan Note (Signed)
A1c

## 2013-04-09 NOTE — Progress Notes (Signed)
Pre visit review using our clinic review tool, if applicable. No additional management support is needed unless otherwise documented below in the visit note. 

## 2014-01-16 ENCOUNTER — Telehealth: Payer: Self-pay | Admitting: Internal Medicine

## 2014-01-16 DIAGNOSIS — Z0000 Encounter for general adult medical examination without abnormal findings: Secondary | ICD-10-CM

## 2014-01-16 NOTE — Telephone Encounter (Signed)
Pt requesting labs a few days before yearly wellness, okay with you?

## 2014-01-17 NOTE — Telephone Encounter (Signed)
Ok Thx 

## 2014-01-24 ENCOUNTER — Encounter: Payer: Self-pay | Admitting: Internal Medicine

## 2014-01-31 ENCOUNTER — Ambulatory Visit (INDEPENDENT_AMBULATORY_CARE_PROVIDER_SITE_OTHER): Payer: BC Managed Care – PPO | Admitting: Internal Medicine

## 2014-01-31 ENCOUNTER — Encounter: Payer: Self-pay | Admitting: Internal Medicine

## 2014-01-31 VITALS — BP 130/84 | HR 88 | Temp 98.6°F | Ht 71.0 in | Wt 229.0 lb

## 2014-01-31 DIAGNOSIS — R739 Hyperglycemia, unspecified: Secondary | ICD-10-CM

## 2014-01-31 DIAGNOSIS — R1013 Epigastric pain: Secondary | ICD-10-CM

## 2014-01-31 DIAGNOSIS — K2100 Gastro-esophageal reflux disease with esophagitis, without bleeding: Secondary | ICD-10-CM

## 2014-01-31 DIAGNOSIS — Z0000 Encounter for general adult medical examination without abnormal findings: Secondary | ICD-10-CM

## 2014-01-31 DIAGNOSIS — G8929 Other chronic pain: Secondary | ICD-10-CM

## 2014-01-31 MED ORDER — DICLOFENAC SODIUM 1.5 % TD SOLN: 5.0000 [drp] | Freq: Four times a day (QID) | TRANSDERMAL | Status: DC | PRN

## 2014-01-31 MED ORDER — SILDENAFIL CITRATE 100 MG PO TABS: 100.0000 mg | ORAL_TABLET | ORAL | Status: DC | PRN

## 2014-01-31 MED ORDER — SCOPOLAMINE 1 MG/3DAYS TD PT72: 1.0000 | MEDICATED_PATCH | TRANSDERMAL | Status: DC

## 2014-01-31 MED ORDER — ALBUTEROL SULFATE HFA 108 (90 BASE) MCG/ACT IN AERS: 2.0000 | INHALATION_SPRAY | Freq: Four times a day (QID) | RESPIRATORY_TRACT | Status: DC | PRN

## 2014-01-31 MED ORDER — TRAMADOL HCL 50 MG PO TABS: 50.0000 mg | ORAL_TABLET | Freq: Two times a day (BID) | ORAL | Status: DC | PRN

## 2014-01-31 MED ORDER — LORAZEPAM 0.5 MG PO TABS: 0.5000 mg | ORAL_TABLET | Freq: Two times a day (BID) | ORAL | Status: DC

## 2014-01-31 MED ORDER — PANTOPRAZOLE SODIUM 40 MG PO TBEC: 40.0000 mg | DELAYED_RELEASE_TABLET | Freq: Every day | ORAL | Status: DC

## 2014-01-31 NOTE — Assessment & Plan Note (Signed)
See Rx changes GI consult Labs

## 2014-01-31 NOTE — Assessment & Plan Note (Signed)
Labs

## 2014-01-31 NOTE — Assessment & Plan Note (Signed)
We discussed age appropriate health related issues, including available/recomended screening tests and vaccinations. We discussed a need for adhering to healthy diet and exercise. Labs/EKG were reviewed/ordered. All questions were answered.   

## 2014-01-31 NOTE — Progress Notes (Signed)
Subjective:    HPI  The patient is here for a wellness exam. The patient has been doing well overall without major physical or psychological issues going on lately, except for some aches and pains. C/o indigestion, sour stomach x 1.5 weeks off and on, some bd pain and bloating while eating. He was taking a lot of Motrins over past 2 mo (gym work out pains). OTC Nexium - helped some  BP Readings from Last 3 Encounters:  01/31/14 130/84  04/09/13 132/80  02/02/13 128/80   Wt Readings from Last 3 Encounters:  01/31/14 229 lb (103.874 kg)  04/09/13 227 lb (102.967 kg)  02/02/13 231 lb (104.781 kg)      Review of Systems  Constitutional: Negative for appetite change, fatigue and unexpected weight change.  HENT: Negative for ear pain, nosebleeds, congestion, sore throat, sneezing, trouble swallowing and neck pain.   Eyes: Negative for pain, itching and visual disturbance.  Respiratory: Negative for apnea, cough and chest tightness.   Cardiovascular: Negative for chest pain, palpitations and leg swelling.  Gastrointestinal: Negative for nausea, abdominal pain, diarrhea, blood in stool, abdominal distention and rectal pain.  Genitourinary: Negative for dysuria, urgency, frequency and hematuria.  Musculoskeletal: Positive for arthralgias. Negative for myalgias, back pain, joint swelling and gait problem.  Skin: Negative for rash and wound.  Neurological: Negative for dizziness, tremors, speech difficulty and weakness.  Psychiatric/Behavioral: Negative for suicidal ideas, sleep disturbance, dysphoric mood and agitation. The patient is not nervous/anxious.        Objective:   Physical Exam  Constitutional: He is oriented to person, place, and time. He appears well-developed and well-nourished. No distress.  HENT:  Head: Normocephalic and atraumatic.  Right Ear: External ear normal.  Left Ear: External ear normal.  Nose: Nose normal.  Mouth/Throat: Oropharynx is clear and moist. No  oropharyngeal exudate.  Eyes: Conjunctivae and EOM are normal. Pupils are equal, round, and reactive to light. Right eye exhibits no discharge. Left eye exhibits no discharge. No scleral icterus.  Neck: Normal range of motion. Neck supple. No JVD present. No tracheal deviation present. No thyromegaly present.  Cardiovascular: Normal rate, regular rhythm, normal heart sounds and intact distal pulses.  Exam reveals no gallop and no friction rub.   No murmur heard. Pulmonary/Chest: Effort normal and breath sounds normal. No stridor. No respiratory distress. He has no wheezes. He has no rales. He exhibits no tenderness.  Abdominal: Soft. Bowel sounds are normal. He exhibits no distension and no mass. There is no tenderness. There is no rebound and no guarding.  Genitourinary: Rectum normal, prostate normal and penis normal. Guaiac negative stool. No penile tenderness.  Musculoskeletal: Normal range of motion. He exhibits tenderness. He exhibits no edema.  Lymphadenopathy:    He has no cervical adenopathy.  Neurological: He is alert and oriented to person, place, and time. He has normal reflexes. No cranial nerve deficit. He exhibits normal muscle tone. Coordination normal.  Skin: Skin is warm and dry. No rash noted. He is not diaphoretic. No erythema. No pallor.  Psychiatric: He has a normal mood and affect. His behavior is normal. Judgment and thought content normal.    Lab Results  Component Value Date   WBC 5.3 08/02/2012   HGB 13.5 08/02/2012   HCT 42.1 08/02/2012   PLT 176.0 08/02/2012   GLUCOSE 90 04/09/2013   CHOL 117 04/09/2013   TRIG 69.0 04/09/2013   HDL 36.40* 04/09/2013   LDLCALC 67 04/09/2013   ALT 29 04/09/2013  AST 23 04/09/2013   NA 141 04/09/2013   K 4.6 04/09/2013   CL 107 04/09/2013   CREATININE 1.3 04/09/2013   BUN 10 04/09/2013   CO2 28 04/09/2013   TSH 0.84 08/02/2012   PSA 0.18 08/02/2012   HGBA1C 6.1 04/09/2013         Assessment & Plan:

## 2014-01-31 NOTE — Patient Instructions (Signed)
Gastroesophageal Reflux Disease, Adult Gastroesophageal reflux disease (GERD) happens when acid from your stomach flows up into the esophagus. When acid comes in contact with the esophagus, the acid causes soreness (inflammation) in the esophagus. Over time, GERD may create small holes (ulcers) in the lining of the esophagus. CAUSES   Increased body weight. This puts pressure on the stomach, making acid rise from the stomach into the esophagus.  Smoking. This increases acid production in the stomach.  Drinking alcohol. This causes decreased pressure in the lower esophageal sphincter (valve or ring of muscle between the esophagus and stomach), allowing acid from the stomach into the esophagus.  Late evening meals and a full stomach. This increases pressure and acid production in the stomach.  A malformed lower esophageal sphincter. Sometimes, no cause is found. SYMPTOMS   Burning pain in the lower part of the mid-chest behind the breastbone and in the mid-stomach area. This may occur twice a week or more often.  Trouble swallowing.  Sore throat.  Dry cough.  Asthma-like symptoms including chest tightness, shortness of breath, or wheezing. DIAGNOSIS  Your caregiver may be able to diagnose GERD based on your symptoms. In some cases, X-rays and other tests may be done to check for complications or to check the condition of your stomach and esophagus. TREATMENT  Your caregiver may recommend over-the-counter or prescription medicines to help decrease acid production. Ask your caregiver before starting or adding any new medicines.  HOME CARE INSTRUCTIONS   Change the factors that you can control. Ask your caregiver for guidance concerning weight loss, quitting smoking, and alcohol consumption.  Avoid foods and drinks that make your symptoms worse, such as:  Caffeine or alcoholic drinks.  Chocolate.  Peppermint or mint flavorings.  Garlic and onions.  Spicy foods.  Citrus fruits,  such as oranges, lemons, or limes.  Tomato-based foods such as sauce, chili, salsa, and pizza.  Fried and fatty foods.  Avoid lying down for the 3 hours prior to your bedtime or prior to taking a nap.  Eat small, frequent meals instead of large meals.  Wear loose-fitting clothing. Do not wear anything tight around your waist that causes pressure on your stomach.  Raise the head of your bed 6 to 8 inches with wood blocks to help you sleep. Extra pillows will not help.  Only take over-the-counter or prescription medicines for pain, discomfort, or fever as directed by your caregiver.  Do not take aspirin, ibuprofen, or other nonsteroidal anti-inflammatory drugs (NSAIDs). SEEK IMMEDIATE MEDICAL CARE IF:   You have pain in your arms, neck, jaw, teeth, or back.  Your pain increases or changes in intensity or duration.  You develop nausea, vomiting, or sweating (diaphoresis).  You develop shortness of breath, or you faint.  Your vomit is green, yellow, black, or looks like coffee grounds or blood.  Your stool is red, bloody, or black. These symptoms could be signs of other problems, such as heart disease, gastric bleeding, or esophageal bleeding. MAKE SURE YOU:   Understand these instructions.  Will watch your condition.  Will get help right away if you are not doing well or get worse. Document Released: 11/11/2004 Document Revised: 04/26/2011 Document Reviewed: 08/21/2010 [employer] Patient Information 2015 [employer]. This information is not intended to replace advice given to you by your health care provider. Make sure you discuss any questions you have with your health care provider.  

## 2014-01-31 NOTE — Assessment & Plan Note (Signed)
12/15 r/o PUD, lactose intolerance GI ref Dr Marina GoodellPerry Protonix bid Milk free diet EKG - WNL

## 2014-02-04 ENCOUNTER — Other Ambulatory Visit (INDEPENDENT_AMBULATORY_CARE_PROVIDER_SITE_OTHER): Payer: BC Managed Care – PPO

## 2014-02-04 DIAGNOSIS — R1013 Epigastric pain: Secondary | ICD-10-CM

## 2014-02-04 DIAGNOSIS — Z0000 Encounter for general adult medical examination without abnormal findings: Secondary | ICD-10-CM

## 2014-02-04 DIAGNOSIS — G8929 Other chronic pain: Secondary | ICD-10-CM

## 2014-02-04 LAB — URINALYSIS, ROUTINE W REFLEX MICROSCOPIC
Bilirubin Urine: NEGATIVE
KETONES UR: NEGATIVE
LEUKOCYTES UA: NEGATIVE
Nitrite: NEGATIVE
TOTAL PROTEIN, URINE-UPE24: NEGATIVE
Urine Glucose: NEGATIVE
Urobilinogen, UA: 0.2 (ref 0.0–1.0)
WBC, UA: NONE SEEN (ref 0–?)
pH: 6 (ref 5.0–8.0)

## 2014-02-04 LAB — CBC WITH DIFFERENTIAL/PLATELET
Basophils Absolute: 0 10*3/uL (ref 0.0–0.1)
Basophils Relative: 0.2 % (ref 0.0–3.0)
EOS ABS: 0.1 10*3/uL (ref 0.0–0.7)
Eosinophils Relative: 3.1 % (ref 0.0–5.0)
HCT: 43.1 % (ref 39.0–52.0)
Hemoglobin: 14.2 g/dL (ref 13.0–17.0)
LYMPHS PCT: 36.8 % (ref 12.0–46.0)
Lymphs Abs: 1.5 10*3/uL (ref 0.7–4.0)
MCHC: 33.1 g/dL (ref 30.0–36.0)
MCV: 91.3 fl (ref 78.0–100.0)
Monocytes Absolute: 0.3 10*3/uL (ref 0.1–1.0)
Monocytes Relative: 8.5 % (ref 3.0–12.0)
NEUTROS PCT: 51.4 % (ref 43.0–77.0)
Neutro Abs: 2 10*3/uL (ref 1.4–7.7)
Platelets: 177 10*3/uL (ref 150.0–400.0)
RBC: 4.72 Mil/uL (ref 4.22–5.81)
RDW: 13.2 % (ref 11.5–15.5)
WBC: 3.9 10*3/uL — ABNORMAL LOW (ref 4.0–10.5)

## 2014-02-04 LAB — BASIC METABOLIC PANEL
BUN: 13 mg/dL (ref 6–23)
CHLORIDE: 107 meq/L (ref 96–112)
CO2: 27 meq/L (ref 19–32)
Calcium: 9.2 mg/dL (ref 8.4–10.5)
Creatinine, Ser: 1.2 mg/dL (ref 0.4–1.5)
GFR: 65.43 mL/min (ref >60.00)
Glucose, Bld: 99 mg/dL (ref 70–99)
Potassium: 4.2 mEq/L (ref 3.5–5.1)
Sodium: 140 mEq/L (ref 135–145)

## 2014-02-04 LAB — HEPATIC FUNCTION PANEL
ALT: 17 U/L (ref 0–53)
AST: 19 U/L (ref 0–37)
Albumin: 4.1 g/dL (ref 3.5–5.2)
Alkaline Phosphatase: 48 U/L (ref 39–117)
BILIRUBIN DIRECT: 0.1 mg/dL (ref 0.0–0.3)
BILIRUBIN TOTAL: 0.7 mg/dL (ref 0.2–1.2)
TOTAL PROTEIN: 6.7 g/dL (ref 6.0–8.3)

## 2014-02-04 LAB — LIPID PANEL
CHOLESTEROL: 162 mg/dL (ref 0–200)
HDL: 26.8 mg/dL — AB (ref >39.00)
LDL Cholesterol: 119 mg/dL — ABNORMAL HIGH (ref 0–99)
NonHDL: 135.2
TRIGLYCERIDES: 81 mg/dL (ref 0.0–149.0)
Total CHOL/HDL Ratio: 6
VLDL: 16.2 mg/dL (ref 0.0–40.0)

## 2014-02-04 LAB — H. PYLORI ANTIBODY, IGG: H PYLORI IGG: NEGATIVE

## 2014-02-05 LAB — TSH: TSH: 0.76 u[IU]/mL (ref 0.35–4.50)

## 2014-02-05 LAB — PSA: PSA: 0.13 ng/mL (ref 0.10–4.00)

## 2014-02-11 ENCOUNTER — Encounter: Payer: Self-pay | Admitting: Internal Medicine

## 2014-02-14 NOTE — Telephone Encounter (Signed)
Patient would like to proceed with urology referral. Please enter new referral. Thank you!

## 2014-02-25 NOTE — Telephone Encounter (Signed)
Please see note below from 02/14/14. Pt is requesting urology referral. Thank you!

## 2014-02-26 ENCOUNTER — Other Ambulatory Visit: Payer: Self-pay | Admitting: Internal Medicine

## 2014-02-26 DIAGNOSIS — R3129 Other microscopic hematuria: Secondary | ICD-10-CM

## 2014-03-01 ENCOUNTER — Encounter: Payer: Self-pay | Admitting: Internal Medicine

## 2014-03-19 ENCOUNTER — Telehealth: Payer: Self-pay

## 2014-03-19 NOTE — Telephone Encounter (Signed)
PA started via cover my meds.  

## 2014-03-19 NOTE — Telephone Encounter (Signed)
PA for diclofenac

## 2014-03-27 NOTE — Telephone Encounter (Signed)
PA approved.   Response faxed to pharmacy

## 2014-05-17 ENCOUNTER — Ambulatory Visit (INDEPENDENT_AMBULATORY_CARE_PROVIDER_SITE_OTHER): Payer: BC Managed Care – PPO | Admitting: Internal Medicine

## 2014-05-17 ENCOUNTER — Other Ambulatory Visit (INDEPENDENT_AMBULATORY_CARE_PROVIDER_SITE_OTHER): Payer: BC Managed Care – PPO

## 2014-05-17 ENCOUNTER — Encounter: Payer: Self-pay | Admitting: Internal Medicine

## 2014-05-17 VITALS — BP 110/80 | HR 90 | Temp 98.5°F | Ht 71.0 in | Wt 228.5 lb

## 2014-05-17 DIAGNOSIS — R509 Fever, unspecified: Secondary | ICD-10-CM | POA: Diagnosis not present

## 2014-05-17 DIAGNOSIS — J310 Chronic rhinitis: Secondary | ICD-10-CM

## 2014-05-17 DIAGNOSIS — R059 Cough, unspecified: Secondary | ICD-10-CM

## 2014-05-17 DIAGNOSIS — R05 Cough: Secondary | ICD-10-CM

## 2014-05-17 LAB — CBC WITH DIFFERENTIAL/PLATELET
BASOS PCT: 0.3 % (ref 0.0–3.0)
Basophils Absolute: 0 10*3/uL (ref 0.0–0.1)
Eosinophils Absolute: 0.3 10*3/uL (ref 0.0–0.7)
Eosinophils Relative: 4.4 % (ref 0.0–5.0)
HCT: 41.4 % (ref 39.0–52.0)
Hemoglobin: 14.1 g/dL (ref 13.0–17.0)
LYMPHS PCT: 24.8 % (ref 12.0–46.0)
Lymphs Abs: 1.4 10*3/uL (ref 0.7–4.0)
MCHC: 34 g/dL (ref 30.0–36.0)
MCV: 88.9 fl (ref 78.0–100.0)
Monocytes Absolute: 0.7 10*3/uL (ref 0.1–1.0)
Monocytes Relative: 12.8 % — ABNORMAL HIGH (ref 3.0–12.0)
Neutro Abs: 3.3 10*3/uL (ref 1.4–7.7)
Neutrophils Relative %: 57.7 % (ref 43.0–77.0)
PLATELETS: 164 10*3/uL (ref 150.0–400.0)
RBC: 4.66 Mil/uL (ref 4.22–5.81)
RDW: 13.3 % (ref 11.5–15.5)
WBC: 5.7 10*3/uL (ref 4.0–10.5)

## 2014-05-17 MED ORDER — AMOXICILLIN 500 MG PO CAPS: 500.0000 mg | ORAL_CAPSULE | Freq: Three times a day (TID) | ORAL | Status: DC

## 2014-05-17 NOTE — Progress Notes (Signed)
Pre visit review using our clinic review tool, if applicable. No additional management support is needed unless otherwise documented below in the visit note. 

## 2014-05-17 NOTE — Patient Instructions (Signed)
Plain Mucinex (NOT D) for thick secretions ;force NON dairy fluids .   Nasal cleansing in the shower as discussed with lather of mild shampoo.After 10 seconds wash off lather while  exhaling through nostrils. Make sure that all residual soap is removed to prevent irritation.  Fluticasone 1 spray in each nostril twice a day as needed. Use the "crossover" technique into opposite nostril spraying toward opposite ear @ 45 degree angle, not straight up into nostril.  Use a Neti pot daily only  as needed for significant sinus congestion; going from open side to congested side . Plain Allegra (NOT D )  160 daily , Loratidine 10 mg , OR Zyrtec 10 mg @ bedtime  as needed for itchy eyes & sneezing.  NSAIDS ( Aleve, Advil, Naproxen) or Tylenol every 4 hrs as needed for fever as discussed based on label recommendations.     Fill the  prescription for antibiotic if discolored nasal or chest secretions  appear in the next 48-72 hours.   Your next office appointment will be determined based upon review of your pending labs .  Those instructions will be transmitted to you by My Chart   Critical results will be called.   Followup as needed for any active or acute issue. Please report any significant change in your symptoms.

## 2014-05-17 NOTE — Progress Notes (Signed)
   Subjective:    [name], male    DOB: 02/23/1955, 59 y.o.   MRN: [id number]  HPI He has had fever intermittently since 05/14/14. Originally it was 100-100.8. This morning it was 101. He took an ibuprofen 400 mg this morning.  He has had no specific triggers or predisposing factors. He was on [place]18[place]16 [place]. He was not ill while on the cruise or after returning. Specifically he had no GI symptoms. No significant pets or tick exposures.   He does have some chills with fever. He has frontal pain; malaise; nonproductive cough; clear rhinitis; & pressure in the eyes.   Review of Systems   Sputum production, hemoptysis, or pleuritic pain denied.  No diarrhea present.Cola colored urine or clay colored stools denied.  Dysuria, pyuria, or hematuria not present.  No penile discharge or bleeding noted.  No new rashes, pustules, vesicles.  No redness or swelling of joints.   No loss of control of bladder or bowels.  Radicular type pain absent.  There is no numbness, tingling, or weakness in extremities.       Objective:   Physical Exam  Pertinent or positive findings include: Pattern alopecia is present. He has a goatee He has wax in the left otic canal.  Nares are markedly erythematous. He exhibits hyponasal speech.  EOM & vision intact. There is no evidence of meningismus as neck is supple to passive range of motion.  He has no abdominal tenderness to even deep palpation or percussion.  General appearance is one of good health and nourishment w/o distress. Eyes: No conjunctival inflammation or scleral icterus is present. Oral exam: Dental hygiene is good; lips and gums are healthy appearing.There is no oropharyngeal erythema or exudate noted.  Heart:  Normal rate and regular rhythm. S1 and S2 normal without gallop, murmur, click, rub or other extra sounds   Lungs:Chest clear to auscultation; no wheezes, rhonchi,rales ,or rubs present.No  increased work of breathing.  Abdomen: bowel sounds normal, soft  without masses, organomegaly or hernias noted.  No guarding or rebound . No tenderness over the flanks to percussion Musculoskeletal: Able to lie flat and sit up without help. Negative straight leg raising bilaterally. Gait normal Skin:Warm & dry.  Intact without suspicious lesions or rashes ; no  tenting Lymphatic: No lymphadenopathy is noted about the head, neck, axilla             Assessment & Plan:  #1 fever  #2 rhinitis, probable viral  #3 cough, probably from #2  Plan: See orders and recommendations

## 2014-10-28 ENCOUNTER — Other Ambulatory Visit: Payer: Self-pay | Admitting: Internal Medicine

## 2014-10-28 NOTE — Telephone Encounter (Signed)
Please advise, thanks.

## 2014-10-29 NOTE — Telephone Encounter (Signed)
Called ativan rx to pharm

## 2015-03-11 ENCOUNTER — Other Ambulatory Visit: Payer: Self-pay | Admitting: Internal Medicine

## 2015-03-12 NOTE — Telephone Encounter (Signed)
Called in to [facility] 

## 2015-04-09 ENCOUNTER — Encounter (HOSPITAL_COMMUNITY): Payer: Self-pay

## 2015-04-09 ENCOUNTER — Emergency Department (HOSPITAL_COMMUNITY)
Admission: EM | Admit: 2015-04-09 | Discharge: 2015-04-10 | Disposition: A | Discharge status: Home / Self Care | Payer: BC Managed Care – PPO | Attending: Emergency Medicine | Admitting: Emergency Medicine

## 2015-04-09 DIAGNOSIS — Z23 Encounter for immunization: Secondary | ICD-10-CM | POA: Insufficient documentation

## 2015-04-09 DIAGNOSIS — Y9289 Other specified places as the place of occurrence of the external cause: Secondary | ICD-10-CM | POA: Diagnosis not present

## 2015-04-09 DIAGNOSIS — J449 Chronic obstructive pulmonary disease, unspecified: Secondary | ICD-10-CM | POA: Diagnosis not present

## 2015-04-09 DIAGNOSIS — S0083XA Contusion of other part of head, initial encounter: Secondary | ICD-10-CM | POA: Diagnosis not present

## 2015-04-09 DIAGNOSIS — Z792 Long term (current) use of antibiotics: Secondary | ICD-10-CM | POA: Insufficient documentation

## 2015-04-09 DIAGNOSIS — K219 Gastro-esophageal reflux disease without esophagitis: Secondary | ICD-10-CM | POA: Diagnosis not present

## 2015-04-09 DIAGNOSIS — W01118A Fall on same level from slipping, tripping and stumbling with subsequent striking against other sharp object, initial encounter: Secondary | ICD-10-CM | POA: Diagnosis not present

## 2015-04-09 DIAGNOSIS — S0081XA Abrasion of other part of head, initial encounter: Secondary | ICD-10-CM | POA: Diagnosis not present

## 2015-04-09 DIAGNOSIS — Y9389 Activity, other specified: Secondary | ICD-10-CM | POA: Diagnosis not present

## 2015-04-09 DIAGNOSIS — Z79899 Other long term (current) drug therapy: Secondary | ICD-10-CM | POA: Diagnosis not present

## 2015-04-09 DIAGNOSIS — S61512A Laceration without foreign body of left wrist, initial encounter: Secondary | ICD-10-CM | POA: Insufficient documentation

## 2015-04-09 DIAGNOSIS — S51812A Laceration without foreign body of left forearm, initial encounter: Secondary | ICD-10-CM | POA: Insufficient documentation

## 2015-04-09 DIAGNOSIS — S80212A Abrasion, left knee, initial encounter: Secondary | ICD-10-CM | POA: Diagnosis not present

## 2015-04-09 DIAGNOSIS — Y998 Other external cause status: Secondary | ICD-10-CM | POA: Diagnosis not present

## 2015-04-09 DIAGNOSIS — Z87891 Personal history of nicotine dependence: Secondary | ICD-10-CM | POA: Insufficient documentation

## 2015-04-09 DIAGNOSIS — W19XXXA Unspecified fall, initial encounter: Secondary | ICD-10-CM

## 2015-04-09 MED ORDER — IBUPROFEN 800 MG PO TABS
800.0000 mg | ORAL_TABLET | Freq: Once | ORAL | Status: AC
  Administered 2015-04-09: 800 mg via ORAL
  Filled 2015-04-09: qty 1

## 2015-04-09 MED ORDER — LIDOCAINE-EPINEPHRINE (PF) 2 %-1:200000 IJ SOLN
10.0000 mL | Freq: Once | INTRAMUSCULAR | Status: AC
  Administered 2015-04-09: 5 mL

## 2015-04-09 MED ORDER — IBUPROFEN 600 MG PO TABS: 600.0000 mg | ORAL_TABLET | Freq: Four times a day (QID) | ORAL | Status: DC | PRN

## 2015-04-09 MED ORDER — BACITRACIN ZINC 500 UNIT/GM EX OINT: 1.0000 "application " | TOPICAL_OINTMENT | Freq: Two times a day (BID) | CUTANEOUS | Status: DC

## 2015-04-09 MED ORDER — TETANUS-DIPHTH-ACELL PERTUSSIS 5-2.5-18.5 LF-MCG/0.5 IM SUSP
0.5000 mL | Freq: Once | INTRAMUSCULAR | Status: AC
  Administered 2015-04-09: 0.5 mL via INTRAMUSCULAR
  Filled 2015-04-09: qty 0.5

## 2015-04-09 MED ORDER — LIDOCAINE-EPINEPHRINE (PF) 2 %-1:200000 IJ SOLN
INTRAMUSCULAR | Status: AC
  Administered 2015-04-09: 5 mL
  Filled 2015-04-09: qty 20

## 2015-04-09 NOTE — ED Notes (Signed)
Pt tripped and fell through a glass door and cut his forearm, he has a laceration to the backside of his arm and a hematoma to his head, no loc, no blood thinners

## 2015-04-09 NOTE — Discharge Instructions (Signed)
Laceration Care, Adult °A laceration is a cut that goes through all of the layers of the skin and into the tissue that is right under the skin. Some lacerations heal on their own. Others need to be closed with stitches (sutures), staples, skin adhesive strips, or skin glue. Proper laceration care minimizes the risk of infection and helps the laceration to heal better. °HOW TO CARE FOR YOUR LACERATION °If sutures or staples were used: °· Keep the wound clean and dry. °· If you were given a bandage (dressing), you should change it at least one time per day or as told by your health care provider. You should also change it if it becomes wet or dirty. °· Keep the wound completely dry for the first 24 hours or as told by your health care provider. After that time, you may shower or bathe. However, make sure that the wound is not soaked in water until after the sutures or staples have been removed. °· Clean the wound one time each day or as told by your health care provider: °· Wash the wound with soap and water. °· Rinse the wound with water to remove all soap. °· Pat the wound dry with a clean towel. Do not rub the wound. °· After cleaning the wound, apply a thin layer of antibiotic ointment as told by your health care provider. This will help to prevent infection and keep the dressing from sticking to the wound. °· Have the sutures or staples removed as told by your health care provider. °If skin adhesive strips were used: °· Keep the wound clean and dry. °· If you were given a bandage (dressing), you should change it at least one time per day or as told by your health care provider. You should also change it if it becomes dirty or wet. °· Do not get the skin adhesive strips wet. You may shower or bathe, but be careful to keep the wound dry. °· If the wound gets wet, pat it dry with a clean towel. Do not rub the wound. °· Skin adhesive strips fall off on their own. You may trim the strips as the wound heals. Do not  remove skin adhesive strips that are still stuck to the wound. They will fall off in time. °If skin glue was used: °· Try to keep the wound dry, but you may briefly wet it in the shower or bath. Do not soak the wound in water, such as by swimming. °· After you have showered or bathed, gently pat the wound dry with a clean towel. Do not rub the wound. °· Do not do any activities that will make you sweat heavily until the skin glue has fallen off on its own. °· Do not apply liquid, cream, or ointment medicine to the wound while the skin glue is in place. Using those may loosen the film before the wound has healed. °· If you were given a bandage (dressing), you should change it at least one time per day or as told by your health care provider. You should also change it if it becomes dirty or wet. °· If a dressing is placed over the wound, be careful not to apply tape directly over the skin glue. Doing that may cause the glue to be pulled off before the wound has healed. °· Do not pick at the glue. The skin glue usually remains in place for 5-10 days, then it falls off of the skin. °General Instructions °· Take over-the-counter and prescription   medicines only as told by your health care provider.  If you were prescribed an antibiotic medicine or ointment, take or apply it as told by your doctor. Do not stop using it even if your condition improves.  To help prevent scarring, make sure to cover your wound with sunscreen whenever you are outside after stitches are removed, after adhesive strips are removed, or when glue remains in place and the wound is healed. Make sure to wear a sunscreen of at least 30 SPF.  Do not scratch or pick at the wound.  Keep all follow-up visits as told by your health care provider. This is important.  Check your wound every day for signs of infection. Watch for:  Redness, swelling, or pain.  Fluid, blood, or pus.  Raise (elevate) the injured area above the level of your heart  while you are sitting or lying down, if possible. SEEK MEDICAL CARE IF:  You received a tetanus shot and you have swelling, severe pain, redness, or bleeding at the injection site.  You have a fever.  A wound that was closed breaks open.  You notice a bad smell coming from your wound or your dressing.  You notice something coming out of the wound, such as wood or glass.  Your pain is not controlled with medicine.  You have increased redness, swelling, or pain at the site of your wound.  You have fluid, blood, or pus coming from your wound.  You notice a change in the color of your skin near your wound.  You need to change the dressing frequently due to fluid, blood, or pus draining from the wound.  You develop a new rash.  You develop numbness around the wound. SEEK IMMEDIATE MEDICAL CARE IF:  You develop severe swelling around the wound.  Your pain suddenly increases and is severe.  You develop painful lumps near the wound or on skin that is anywhere on your body.  You have a red streak going away from your wound.  The wound is on your hand or foot and you cannot properly move a finger or toe.  The wound is on your hand or foot and you notice that your fingers or toes look pale or bluish.   This information is not intended to replace advice given to you by your health care provider. Make sure you discuss any questions you have with your health care provider.   Document Released: 02/01/2005 Document Revised: 06/18/2014 Document Reviewed: 01/28/2014 Elsevier Interactive Patient Education 2016 [employer]  Abrasion An abrasion is a cut or scrape on the outer surface of your skin. An abrasion does not extend through all of the layers of your skin. It is important to care for your abrasion properly to prevent infection. CAUSES Most abrasions are caused by falling on or gliding across the ground or another surface. When your skin rubs on something, the outer and inner  layer of skin rubs off.  SYMPTOMS A cut or scrape is the main symptom of this condition. The scrape may be bleeding, or it may appear red or pink. If there was an associated fall, there may be an underlying bruise. DIAGNOSIS An abrasion is diagnosed with a physical exam. TREATMENT Treatment for this condition depends on how large and deep the abrasion is. Usually, your abrasion will be cleaned with water and mild soap. This removes any dirt or debris that may be stuck. An antibiotic ointment may be applied to the abrasion to help prevent infection. A  bandage (dressing) may be placed on the abrasion to keep it clean. You may also need a tetanus shot. HOME CARE INSTRUCTIONS Medicines  Take or apply medicines only as directed by your health care provider.  If you were prescribed an antibiotic ointment, finish all of it even if you start to feel better. Wound Care  Clean the wound with mild soap and water 2-3 times per day or as directed by your health care provider. Pat your wound dry with a clean towel. Do not rub it.  There are many different ways to close and cover a wound. Follow instructions from your health care provider about:  Wound care.  Dressing changes and removal.  Check your wound every day for signs of infection. Watch for:  Redness, swelling, or pain.  Fluid, blood, or pus. General Instructions  Keep the dressing dry as directed by your health care provider. Do not take baths, swim, use a hot tub, or do anything that would put your wound underwater until your health care provider approves.  If there is swelling, raise (elevate) the injured area above the level of your heart while you are sitting or lying down.  Keep all follow-up visits as directed by your health care provider. This is important. SEEK MEDICAL CARE IF:  You received a tetanus shot and you have swelling, severe pain, redness, or bleeding at the injection site.  Your pain is not controlled with  medicine.  You have increased redness, swelling, or pain at the site of your wound. SEEK IMMEDIATE MEDICAL CARE IF:  You have a red streak going away from your wound.  You have a fever.  You have fluid, blood, or pus coming from your wound.  You notice a bad smell coming from your wound or your dressing.   This information is not intended to replace advice given to you by your health care provider. Make sure you discuss any questions you have with your health care provider.   Document Released: 11/11/2004 Document Revised: 10/23/2014 Document Reviewed: 01/30/2014 Elsevier Interactive Patient Education [employer]

## 2015-04-09 NOTE — ED Provider Notes (Signed)
CSN: [id number]     Arrival date & time 04/09/15  2026 History  By signing my name below, I, [facility], attest that this documentation has been prepared under the direction and in the presence of [employer], PA-C. Electronically Signed: [name], ED Scribe. 04/09/2015. 11:49 PM.   Chief Complaint  Patient presents with  . Extremity Laceration   The history is provided by the patient. No language interpreter was used.   HPI Comments: Kenneth [name] is a 60 y.o. male who presents to the Emergency Department complaining of mildly painful, medium-sized left forearm laceration after a fall that occurred earlier tonight PTA. Patient reports that he tripped on the two steps leading up to his front door and reached out to brace his fall, falling forward and breaking the glass with his left arm, lacerating his arm, and striking his forehead on the doorjamb. He is unable to recall the date of his last tetanus shot. Per patient, he denies taking blood thinners. He denies LOC, nausea, vomiting, and HA.  Past Medical History  Diagnosis Date  . GERD (gastroesophageal reflux disease)   . COPD (chronic obstructive pulmonary disease) (HCC)   . Low back pain     L4-5 disk   History reviewed. No pertinent past surgical history. Family History  Problem Relation Age of Onset  . Cancer Father     lymphoma   Social History  Substance Use Topics  . Smoking status: Former Games developer  . Smokeless tobacco: None  . Alcohol Use: No    Review of Systems  Gastrointestinal: Negative for nausea and vomiting.  Skin: Positive for wound.  Neurological: Negative for syncope and headaches.  All other systems reviewed and are negative.   Allergies  Aspirin  Home Medications   Prior to Admission medications   Medication Sig Start Date End Date Taking? Authorizing Provider  albuterol (PROVENTIL HFA;VENTOLIN HFA) 108 (90 BASE) MCG/ACT inhaler Inhale 2 puffs into the lungs every 6 (six) hours as  needed. 01/31/14 01/31/15  Aleksei [name] V, MD  amoxicillin (AMOXIL) 500 MG capsule Take 1 capsule (500 mg total) by mouth 3 (three) times daily. 05/17/14   Pecola Lawless, MD  atorvastatin (LIPITOR) 10 MG tablet Take 1 tablet (10 mg total) by mouth daily. 02/02/13   Aleksei [name] V, MD  bacitracin ointment Apply 1 application topically 2 (two) times daily. 04/09/15   Antony Madura, PA-C  Cholecalciferol (VITAMIN D3) 1000 UNITS CAPS Take 1 capsule (1,000 Units total) by mouth daily. 03/01/11   Aleksei [name] V, MD  Diclofenac Sodium (PENNSAID) 1.5 % SOLN Place 0.3 mLs onto the skin 4 (four) times daily as needed. 01/31/14   Aleksei [name] V, MD  ibuprofen (ADVIL,MOTRIN) 600 MG tablet Take 1 tablet (600 mg total) by mouth every 6 (six) hours as needed. 04/09/15   Antony Madura, PA-C  loratadine (CLARITIN) 10 MG tablet Take 1 tablet (10 mg total) by mouth daily. 08/02/12   Aleksei [name] V, MD  LORazepam (ATIVAN) 0.5 MG tablet TAKE 1 TO 2 TABLETS BY MOUTH TWICE A DAY 10/28/14   Aleksei [name] V, MD  pantoprazole (PROTONIX) 40 MG tablet Take 1 tablet (40 mg total) by mouth daily. 01/31/14   Aleksei [name] V, MD  scopolamine (TRANSDERM-SCOP) 1 MG/3DAYS Place 1 patch (1.5 mg total) onto the skin every 3 (three) days. 01/31/14   Aleksei [name] V, MD  traMADol (ULTRAM) 50 MG tablet TAKE 1 TO 2 TABLETS BY MOUTH TWICE A DAY AS NEEDED 03/11/15  Aleksei [name] V, MD   BP 151/93 mmHg  Pulse 82  Temp(Src) 98.2 F (36.8 C) (Oral)  Resp 20  SpO2 98%   Physical Exam  Constitutional: He is oriented to person, place, and time. He appears well-developed and well-nourished. No distress.  Nontoxic/nonseptic appearing  HENT:  Head: Normocephalic. Head is with abrasion and with contusion. Head is without raccoon's eyes and without Battle's sign.    Mouth/Throat: Oropharynx is clear and moist.  Eyes: Conjunctivae and EOM are normal. Pupils are equal, round, and reactive to light. No scleral  icterus.  Neck: Normal range of motion. Neck supple. No tracheal deviation present.  No nuchal rigidity or meningismus  Cardiovascular: Normal rate.   Pulmonary/Chest: Effort normal. No respiratory distress.  Musculoskeletal: Normal range of motion.       Left wrist: He exhibits laceration. He exhibits normal range of motion, no bony tenderness, no swelling, no effusion and no crepitus.       Left knee: He exhibits normal range of motion.       Left forearm: He exhibits tenderness and laceration. He exhibits no bony tenderness, no swelling and no edema.       Arms:      Left hand: Normal.       Hands:      Legs: 6cm laceration to volar L forearm 0.5cm laceration to volar L wrist  Neurological: He is alert and oriented to person, place, and time. He exhibits normal muscle tone. Coordination normal.  Ambulatory with steady gait. No focal deficits appreciated.  Skin: Skin is warm and dry. No rash noted. He is not diaphoretic. No erythema. No pallor.  Psychiatric: He has a normal mood and affect. His behavior is normal.  Nursing note and vitals reviewed.   ED Course  Procedures  DIAGNOSTIC STUDIES: Oxygen Saturation is 98% on RA, normal by my interpretation.    COORDINATION OF CARE: 10:34 PM Will perform laceration repair. Discussed treatment plan with pt at bedside and pt agreed to plan.  Labs Review Labs Reviewed - No data to display  Imaging Review No results found.   I have personally reviewed and evaluated these images and lab results as part of my medical decision-making.   EKG Interpretation None       LACERATION REPAIR Performed by: Antony Madura Authorized by: Antony Madura Consent: Verbal consent obtained. Risks and benefits: risks, benefits and alternatives were discussed Consent given by: patient Patient identity confirmed: provided demographic data Prepped and Draped in normal sterile fashion Wound explored  Laceration Location: left wrist  Laceration  Length: 0.5cm  No Foreign Bodies seen or palpated  Anesthesia: local infiltration  Local anesthetic: lidocaine 2% with epinephrine  Anesthetic total: 1 ml  Irrigation method: syringe Amount of cleaning: standard  Skin closure: 4-0 ethilon  Number of sutures: 1  Technique: simple interrupted  Patient tolerance: Patient tolerated the procedure well with no immediate complications.  LACERATION REPAIR Performed by: Antony Madura Authorized by: Antony Madura Consent: Verbal consent obtained. Risks and benefits: risks, benefits and alternatives were discussed Consent given by: patient Patient identity confirmed: provided demographic data Prepped and Draped in normal sterile fashion Wound explored  Laceration Location: left forearm  Laceration Length: 6cm  No Foreign Bodies seen or palpated  Anesthesia: local infiltration  Local anesthetic: lidocaine 2% with epinephrine  Anesthetic total: 5 ml  Irrigation method: syringe Amount of cleaning: standard  Skin closure: 4-0 ethilon  Number of sutures: 4  Technique: simple interrupted  Patient  tolerance: Patient tolerated the procedure well with no immediate complications.  MDM   Final diagnoses:  Laceration of forearm, left, initial encounter  Laceration of wrist, left, initial encounter  Abrasion, knee, left, initial encounter  Contusion of forehead, initial encounter  Abrasion of forehead, initial encounter  Fall, initial encounter    Tdap booster given. Pressure irrigation performed. Laceration occurred < 8 hours prior to repair which was well tolerated. Pt has no comorbidities to effect normal wound healing. Discussed suture home care with pt and answered questions. Pt to follow up with PCP for wound check and suture removal in 10 days. Pt is hemodynamically stable with no complaints prior to discharge.    I personally performed the services described in this documentation, which was scribed in my presence. The  recorded information has been reviewed and is accurate.    Filed Vitals:   04/09/15 2034  BP: 151/93  Pulse: 82  Temp: 98.2 F (36.8 C)  TempSrc: Oral  Resp: 20  SpO2: 98%      Antony Madura, PA-C 04/09/15 [id number]  Tilden Fossa, MD 04/11/15 [phone]

## 2015-04-23 ENCOUNTER — Ambulatory Visit: Payer: BC Managed Care – PPO | Admitting: Nurse Practitioner

## 2015-05-16 ENCOUNTER — Encounter: Payer: Self-pay | Admitting: Internal Medicine

## 2015-05-16 ENCOUNTER — Ambulatory Visit (INDEPENDENT_AMBULATORY_CARE_PROVIDER_SITE_OTHER): Payer: BC Managed Care – PPO | Admitting: Internal Medicine

## 2015-05-16 ENCOUNTER — Ambulatory Visit (INDEPENDENT_AMBULATORY_CARE_PROVIDER_SITE_OTHER)
Admission: RE | Admit: 2015-05-16 | Discharge: 2015-05-16 | Disposition: A | Discharge status: Home / Self Care | Payer: BC Managed Care – PPO | Source: Ambulatory Visit | Attending: Internal Medicine | Admitting: Internal Medicine

## 2015-05-16 ENCOUNTER — Other Ambulatory Visit (INDEPENDENT_AMBULATORY_CARE_PROVIDER_SITE_OTHER): Payer: BC Managed Care – PPO

## 2015-05-16 VITALS — BP 130/76 | HR 70 | Ht 71.0 in | Wt 220.0 lb

## 2015-05-16 DIAGNOSIS — H9313 Tinnitus, bilateral: Secondary | ICD-10-CM

## 2015-05-16 DIAGNOSIS — J4520 Mild intermittent asthma, uncomplicated: Secondary | ICD-10-CM

## 2015-05-16 DIAGNOSIS — Z0000 Encounter for general adult medical examination without abnormal findings: Secondary | ICD-10-CM | POA: Diagnosis not present

## 2015-05-16 DIAGNOSIS — R05 Cough: Secondary | ICD-10-CM | POA: Diagnosis not present

## 2015-05-16 DIAGNOSIS — R059 Cough, unspecified: Secondary | ICD-10-CM

## 2015-05-16 LAB — LIPID PANEL
CHOL/HDL RATIO: 4
Cholesterol: 151 mg/dL (ref 0–200)
HDL: 38.7 mg/dL — AB (ref >39.00)
LDL Cholesterol: 94 mg/dL (ref 0–99)
NonHDL: 112.49
TRIGLYCERIDES: 94 mg/dL (ref 0.0–149.0)
VLDL: 18.8 mg/dL (ref 0.0–40.0)

## 2015-05-16 LAB — BASIC METABOLIC PANEL
BUN: 14 mg/dL (ref 6–23)
CALCIUM: 9.5 mg/dL (ref 8.4–10.5)
CO2: 30 meq/L (ref 19–32)
CREATININE: 1.28 mg/dL (ref 0.40–1.50)
Chloride: 104 mEq/L (ref 96–112)
GFR: 60.48 mL/min (ref >60.00)
GLUCOSE: 94 mg/dL (ref 70–99)
Potassium: 4.6 mEq/L (ref 3.5–5.1)
Sodium: 140 mEq/L (ref 135–145)

## 2015-05-16 LAB — TSH: TSH: 0.84 u[IU]/mL (ref 0.35–4.50)

## 2015-05-16 LAB — URINALYSIS
Bilirubin Urine: NEGATIVE
Ketones, ur: NEGATIVE
Leukocytes, UA: NEGATIVE
NITRITE: NEGATIVE
SPECIFIC GRAVITY, URINE: 1.015 (ref 1.000–1.030)
Total Protein, Urine: NEGATIVE
UROBILINOGEN UA: 0.2 (ref 0.0–1.0)
Urine Glucose: NEGATIVE
pH: 6 (ref 5.0–8.0)

## 2015-05-16 LAB — HEPATIC FUNCTION PANEL
ALBUMIN: 4.8 g/dL (ref 3.5–5.2)
ALK PHOS: 45 U/L (ref 39–117)
ALT: 14 U/L (ref 0–53)
AST: 16 U/L (ref 0–37)
BILIRUBIN TOTAL: 0.5 mg/dL (ref 0.2–1.2)
Bilirubin, Direct: 0.1 mg/dL (ref 0.0–0.3)
Total Protein: 7.4 g/dL (ref 6.0–8.3)

## 2015-05-16 LAB — CBC WITH DIFFERENTIAL/PLATELET
BASOS ABS: 0 10*3/uL (ref 0.0–0.1)
Basophils Relative: 0.2 % (ref 0.0–3.0)
Eosinophils Absolute: 0.1 10*3/uL (ref 0.0–0.7)
Eosinophils Relative: 1.5 % (ref 0.0–5.0)
HCT: 43.4 % (ref 39.0–52.0)
Hemoglobin: 14.5 g/dL (ref 13.0–17.0)
Lymphocytes Relative: 30.5 % (ref 12.0–46.0)
Lymphs Abs: 1.6 10*3/uL (ref 0.7–4.0)
MCHC: 33.5 g/dL (ref 30.0–36.0)
MCV: 90.1 fl (ref 78.0–100.0)
MONO ABS: 0.4 10*3/uL (ref 0.1–1.0)
Monocytes Relative: 8.1 % (ref 3.0–12.0)
NEUTROS ABS: 3.1 10*3/uL (ref 1.4–7.7)
NEUTROS PCT: 59.7 % (ref 43.0–77.0)
PLATELETS: 164 10*3/uL (ref 150.0–400.0)
RBC: 4.81 Mil/uL (ref 4.22–5.81)
RDW: 13.1 % (ref 11.5–15.5)
WBC: 5.2 10*3/uL (ref 4.0–10.5)

## 2015-05-16 LAB — PSA: PSA: 0.17 ng/mL (ref 0.10–4.00)

## 2015-05-16 LAB — HEPATITIS C ANTIBODY: HCV AB: NEGATIVE

## 2015-05-16 MED ORDER — FLUTICASONE FUROATE-VILANTEROL 100-25 MCG/INH IN AEPB: 1.0000 | INHALATION_SPRAY | Freq: Every day | RESPIRATORY_TRACT | Status: DC

## 2015-05-16 MED ORDER — LORAZEPAM 0.5 MG PO TABS: ORAL_TABLET | ORAL | Status: DC

## 2015-05-16 MED ORDER — PANTOPRAZOLE SODIUM 40 MG PO TBEC
40.0000 mg | DELAYED_RELEASE_TABLET | Freq: Every day | ORAL | Status: DC
Start: 2015-05-16 — End: 2016-02-20

## 2015-05-16 MED ORDER — TRAMADOL HCL 50 MG PO TABS: ORAL_TABLET | ORAL | Status: DC

## 2015-05-16 MED ORDER — ALBUTEROL SULFATE HFA 108 (90 BASE) MCG/ACT IN AERS: 2.0000 | INHALATION_SPRAY | Freq: Four times a day (QID) | RESPIRATORY_TRACT | Status: DC | PRN

## 2015-05-16 MED ORDER — ATORVASTATIN CALCIUM 10 MG PO TABS
10.0000 mg | ORAL_TABLET | Freq: Every day | ORAL | Status: DC
Start: 2015-05-16 — End: 2016-01-28

## 2015-05-16 MED ORDER — SCOPOLAMINE 1 MG/3DAYS TD PT72: 1.0000 | MEDICATED_PATCH | TRANSDERMAL | Status: DC

## 2015-05-16 MED ORDER — UMECLIDINIUM-VILANTEROL 62.5-25 MCG/INH IN AEPB: 1.0000 | INHALATION_SPRAY | Freq: Every day | RESPIRATORY_TRACT | Status: DC

## 2015-05-16 MED ORDER — GABAPENTIN 100 MG PO CAPS: 100.0000 mg | ORAL_CAPSULE | Freq: Three times a day (TID) | ORAL | Status: DC

## 2015-05-16 NOTE — Progress Notes (Signed)
Pre visit review using our clinic review tool, if applicable. No additional management support is needed unless otherwise documented below in the visit note. 

## 2015-05-16 NOTE — Assessment & Plan Note (Signed)
Options discussed 

## 2015-05-16 NOTE — Assessment & Plan Note (Signed)
We discussed age appropriate health related issues, including available/recomended screening tests and vaccinations. We discussed a need for adhering to healthy diet and exercise. Labs/EKG were reviewed/ordered. All questions were answered.  Labs 

## 2015-05-16 NOTE — Assessment & Plan Note (Addendum)
Chronic  3/17 Breo qd

## 2015-05-16 NOTE — Progress Notes (Signed)
Subjective:  [name], male    DOB: [birth year]  Age: 60 y.o. MRN: [id number]  CC: Annual Exam   HPI Lorren Splawn presents for a well exam C/o dry coughing spells at times C/o tinnitus - worse  Outpatient Prescriptions Prior to Visit  Medication Sig Dispense Refill  . amoxicillin (AMOXIL) 500 MG capsule Take 1 capsule (500 mg total) by mouth 3 (three) times daily. 30 capsule 0  . atorvastatin (LIPITOR) 10 MG tablet Take 1 tablet (10 mg total) by mouth daily. 90 tablet 3  . bacitracin ointment Apply 1 application topically 2 (two) times daily. 15 g 0  . Cholecalciferol (VITAMIN D3) 1000 UNITS CAPS Take 1 capsule (1,000 Units total) by mouth daily. 100 capsule 3  . Diclofenac Sodium (PENNSAID) 1.5 % SOLN Place 0.3 mLs onto the skin 4 (four) times daily as needed. 150 mL 6  . ibuprofen (ADVIL,MOTRIN) 600 MG tablet Take 1 tablet (600 mg total) by mouth every 6 (six) hours as needed. 30 tablet 0  . loratadine (CLARITIN) 10 MG tablet Take 1 tablet (10 mg total) by mouth daily. 90 tablet 5  . LORazepam (ATIVAN) 0.5 MG tablet TAKE 1 TO 2 TABLETS BY MOUTH TWICE A DAY 180 tablet 1  . pantoprazole (PROTONIX) 40 MG tablet Take 1 tablet (40 mg total) by mouth daily. 30 tablet 3  . scopolamine (TRANSDERM-SCOP) 1 MG/3DAYS Place 1 patch (1.5 mg total) onto the skin every 3 (three) days. 10 patch 0  . traMADol (ULTRAM) 50 MG tablet TAKE 1 TO 2 TABLETS BY MOUTH TWICE A DAY AS NEEDED 60 tablet 3  . albuterol (PROVENTIL HFA;VENTOLIN HFA) 108 (90 BASE) MCG/ACT inhaler Inhale 2 puffs into the lungs every 6 (six) hours as needed. 1 Inhaler 3   No facility-administered medications prior to visit.    ROS Review of Systems  Constitutional: Negative for appetite change, fatigue and unexpected weight change.  HENT: Negative for congestion, nosebleeds, sneezing, sore throat and trouble swallowing.   Eyes: Negative for itching and visual disturbance.  Respiratory: Negative for cough.     Cardiovascular: Negative for chest pain, palpitations and leg swelling.  Gastrointestinal: Negative for nausea, diarrhea, blood in stool and abdominal distention.  Genitourinary: Negative for frequency and hematuria.  Musculoskeletal: Positive for arthralgias. Negative for back pain, joint swelling, gait problem and neck pain.  Skin: Negative for rash.  Neurological: Negative for dizziness, tremors, speech difficulty and weakness.  Psychiatric/Behavioral: Negative for suicidal ideas, sleep disturbance, dysphoric mood and agitation. The patient is not nervous/anxious.     Objective:  BP 130/76 mmHg  Pulse 70  Ht  (1.803 m)  Wt 220 lb (99.791 kg)  BMI 30.70 kg/m2  SpO2 97%  BP Readings from Last 3 Encounters:  05/16/15 130/76  04/09/15 151/93  05/17/14 110/80    Wt Readings from Last 3 Encounters:  05/16/15 220 lb (99.791 kg)  05/17/14 228 lb 8 oz (103.647 kg)  01/31/14 229 lb (103.874 kg)    Physical Exam  Constitutional: He is oriented to person, place, and time. He appears well-developed. No distress.  NAD  HENT:  Mouth/Throat: Oropharynx is clear and moist.  Eyes: Conjunctivae are normal. Pupils are equal, round, and reactive to light.  Neck: Normal range of motion. No JVD present. No thyromegaly present.  Cardiovascular: Normal rate, regular rhythm, normal heart sounds and intact distal pulses.  Exam reveals no gallop and no friction rub.   No murmur heard. Pulmonary/Chest: Effort normal and  breath sounds normal. No respiratory distress. He has no wheezes. He has no rales. He exhibits no tenderness.  Abdominal: Soft. Bowel sounds are normal. He exhibits no distension and no mass. There is no tenderness. There is no rebound and no guarding.  Musculoskeletal: Normal range of motion. He exhibits no edema or tenderness.  Lymphadenopathy:    He has no cervical adenopathy.  Neurological: He is alert and oriented to person, place, and time. He has normal reflexes. No  cranial nerve deficit. He exhibits normal muscle tone. He displays a negative Romberg sign. Coordination and gait normal.  Skin: Skin is warm and dry. No rash noted.  Psychiatric: He has a normal mood and affect. His behavior is normal. Judgment and thought content normal.    Lab Results  Component Value Date   WBC 5.7 05/17/2014   HGB 14.1 05/17/2014   HCT 41.4 05/17/2014   PLT 164.0 05/17/2014   GLUCOSE 99 02/04/2014   CHOL 162 02/04/2014   TRIG 81.0 02/04/2014   HDL 26.80* 02/04/2014   LDLCALC 119* 02/04/2014   ALT 17 02/04/2014   AST 19 02/04/2014   NA 140 02/04/2014   K 4.2 02/04/2014   CL 107 02/04/2014   CREATININE 1.2 02/04/2014   BUN 13 02/04/2014   CO2 27 02/04/2014   TSH 0.76 02/04/2014   PSA 0.13 02/04/2014   HGBA1C 6.1 04/09/2013    No results found.  Assessment & Plan:   Channing MuttersRoy was seen today for annual exam.  Diagnoses and all orders for this visit:  Asthma, mild intermittent, uncomplicated  Tinnitus, bilateral  Other orders -     albuterol (PROVENTIL HFA;VENTOLIN HFA) 108 (90 Base) MCG/ACT inhaler; Inhale 2 puffs into the lungs every 6 (six) hours as needed. -     atorvastatin (LIPITOR) 10 MG tablet; Take 1 tablet (10 mg total) by mouth daily. -     LORazepam (ATIVAN) 0.5 MG tablet;  -     pantoprazole (PROTONIX) 40 MG tablet; Take 1 tablet (40 mg total) by mouth daily. -     scopolamine (TRANSDERM-SCOP, 1.5 MG,) 1 MG/3DAYS; Place 1 patch (1.5 mg total) onto the skin every 3 (three) days. -     traMADol (ULTRAM) 50 MG tablet;   I am having Mr. Magner maintain his Vitamin D3, loratadine, atorvastatin, Diclofenac Sodium, albuterol, scopolamine, pantoprazole, amoxicillin, LORazepam, traMADol, bacitracin, and ibuprofen.  No orders of the defined types were placed in this encounter.   I personally provided Breo inhaler use teaching. After the teaching patient was able to demonstrate it's use effectively. All questions were answered      Follow-up: No  Follow-up on file.  Sonda PrimesAlex Zalen Sequeira, MD

## 2015-05-18 NOTE — Assessment & Plan Note (Signed)
Asthma related CXR

## 2016-01-28 ENCOUNTER — Ambulatory Visit (INDEPENDENT_AMBULATORY_CARE_PROVIDER_SITE_OTHER): Payer: BC Managed Care – PPO | Admitting: Internal Medicine

## 2016-01-28 ENCOUNTER — Encounter: Payer: Self-pay | Admitting: Internal Medicine

## 2016-01-28 VITALS — BP 140/80 | HR 82 | Wt 230.0 lb

## 2016-01-28 DIAGNOSIS — R0789 Other chest pain: Secondary | ICD-10-CM | POA: Diagnosis not present

## 2016-01-28 DIAGNOSIS — I517 Cardiomegaly: Secondary | ICD-10-CM

## 2016-01-28 DIAGNOSIS — R030 Elevated blood-pressure reading, without diagnosis of hypertension: Secondary | ICD-10-CM | POA: Diagnosis not present

## 2016-01-28 DIAGNOSIS — Z0000 Encounter for general adult medical examination without abnormal findings: Secondary | ICD-10-CM

## 2016-01-28 DIAGNOSIS — I429 Cardiomyopathy, unspecified: Secondary | ICD-10-CM

## 2016-01-28 DIAGNOSIS — J4520 Mild intermittent asthma, uncomplicated: Secondary | ICD-10-CM

## 2016-01-28 MED ORDER — LORAZEPAM 0.5 MG PO TABS: ORAL_TABLET | ORAL | 1 refills | Status: DC

## 2016-01-28 MED ORDER — TRAMADOL HCL 50 MG PO TABS: ORAL_TABLET | ORAL | 3 refills | Status: DC

## 2016-01-28 MED ORDER — ALBUTEROL SULFATE HFA 108 (90 BASE) MCG/ACT IN AERS: 2.0000 | INHALATION_SPRAY | Freq: Four times a day (QID) | RESPIRATORY_TRACT | 3 refills | Status: DC | PRN

## 2016-01-28 MED ORDER — SCOPOLAMINE 1 MG/3DAYS TD PT72: 1.0000 | MEDICATED_PATCH | TRANSDERMAL | 0 refills | Status: DC

## 2016-01-28 MED ORDER — GABAPENTIN 100 MG PO CAPS: 100.0000 mg | ORAL_CAPSULE | Freq: Three times a day (TID) | ORAL | 5 refills | Status: DC

## 2016-01-28 MED ORDER — MELOXICAM 15 MG PO TABS: 15.0000 mg | ORAL_TABLET | Freq: Every day | ORAL | 1 refills | Status: DC

## 2016-01-28 MED ORDER — ATORVASTATIN CALCIUM 10 MG PO TABS: 10.0000 mg | ORAL_TABLET | Freq: Every day | ORAL | 3 refills | Status: DC

## 2016-01-28 NOTE — Assessment & Plan Note (Signed)
Repeat ECHO NAS diet Consider ARBs

## 2016-01-28 NOTE — Progress Notes (Signed)
Pre visit review using our clinic review tool, if applicable. No additional management support is needed unless otherwise documented below in the visit note. 

## 2016-01-28 NOTE — Progress Notes (Signed)
Subjective:  [name], male    DOB: 08/03/1955  Age: 60 y.o. MRN: [id number]  CC: Back Pain (Left mid back x 6 months) and Shoulder Pain ( radiating in to Left shoulder blade)   HPI Kenneth SeverinRoy [name] presents for L shoulder and chest wall pain w/ROM: L pectoralis, L rhomboid areas x since March on and off - worse w/activity - like lifting boxes  Outpatient Medications Prior to Visit  Medication Sig Dispense Refill  . albuterol (PROVENTIL HFA;VENTOLIN HFA) 108 (90 Base) MCG/ACT inhaler Inhale 2 puffs into the lungs every 6 (six) hours as needed. 1 Inhaler 3  . amoxicillin (AMOXIL) 500 MG capsule Take 1 capsule (500 mg total) by mouth 3 (three) times daily. 30 capsule 0  . atorvastatin (LIPITOR) 10 MG tablet Take 1 tablet (10 mg total) by mouth daily. 90 tablet 3  . bacitracin ointment Apply 1 application topically 2 (two) times daily. 15 g 0  . Cholecalciferol (VITAMIN D3) 1000 UNITS CAPS Take 1 capsule (1,000 Units total) by mouth daily. 100 capsule 3  . Diclofenac Sodium (PENNSAID) 1.5 % SOLN Place 0.3 mLs onto the skin 4 (four) times daily as needed. 150 mL 6  . fluticasone furoate-vilanterol (BREO ELLIPTA) 100-25 MCG/INH AEPB Inhale 1 puff into the lungs daily. 1 each 5  . gabapentin (NEURONTIN) 100 MG capsule Take 1 capsule (100 mg total) by mouth 3 (three) times daily. 90 capsule 5  . ibuprofen (ADVIL,MOTRIN) 600 MG tablet Take 1 tablet (600 mg total) by mouth every 6 (six) hours as needed. 30 tablet 0  . loratadine (CLARITIN) 10 MG tablet Take 1 tablet (10 mg total) by mouth daily. 90 tablet 5  . LORazepam (ATIVAN) 0.5 MG tablet TAKE 1 TO 2 TABLETS BY MOUTH TWICE A DAY 180 tablet 1  . pantoprazole (PROTONIX) 40 MG tablet Take 1 tablet (40 mg total) by mouth daily. 30 tablet 3  . scopolamine (TRANSDERM-SCOP, 1.5 MG,) 1 MG/3DAYS Place 1 patch (1.5 mg total) onto the skin every 3 (three) days. 10 patch 0  . traMADol (ULTRAM) 50 MG tablet TAKE 1 TO 2 TABLETS BY MOUTH TWICE A DAY AS  NEEDED 60 tablet 3   No facility-administered medications prior to visit.     ROS Review of Systems  Constitutional: Negative for appetite change, fatigue and unexpected weight change.  HENT: Negative for congestion, nosebleeds, sneezing, sore throat and trouble swallowing.   Eyes: Negative for itching and visual disturbance.  Respiratory: Negative for cough.   Cardiovascular: Positive for chest pain. Negative for palpitations and leg swelling.  Gastrointestinal: Negative for abdominal distention, blood in stool, diarrhea and nausea.  Genitourinary: Negative for frequency and hematuria.  Musculoskeletal: Positive for arthralgias. Negative for back pain, gait problem, joint swelling and neck pain.  Skin: Negative for rash.  Neurological: Negative for dizziness, tremors, speech difficulty and weakness.  Psychiatric/Behavioral: Negative for agitation, dysphoric mood and sleep disturbance. The patient is not nervous/anxious.     Objective:  BP 140/80   Pulse 82   Wt 230 lb (104.3 kg)   SpO2 95%   BMI 32.08 kg/m   BP Readings from Last 3 Encounters:  01/28/16 140/80  05/16/15 130/76  04/09/15 151/93    Wt Readings from Last 3 Encounters:  01/28/16 230 lb (104.3 kg)  05/16/15 220 lb (99.8 kg)  05/17/14 228 lb 8 oz (103.6 kg)    Physical Exam  Constitutional: He is oriented to person, place, and time. He appears well-developed.  No distress.  NAD  HENT:  Mouth/Throat: Oropharynx is clear and moist.  Eyes: Conjunctivae are normal. Pupils are equal, round, and reactive to light.  Neck: Normal range of motion. No JVD present. No thyromegaly present.  Cardiovascular: Normal rate, regular rhythm, normal heart sounds and intact distal pulses.  Exam reveals no gallop and no friction rub.   No murmur heard. Pulmonary/Chest: Effort normal and breath sounds normal. No respiratory distress. He has no wheezes. He has no rales. He exhibits no tenderness.  Abdominal: Soft. Bowel sounds  are normal. He exhibits no distension and no mass. There is no tenderness. There is no rebound and no guarding.  Musculoskeletal: Normal range of motion. He exhibits tenderness. He exhibits no edema.  Lymphadenopathy:    He has no cervical adenopathy.  Neurological: He is alert and oriented to person, place, and time. He has normal reflexes. No cranial nerve deficit. He exhibits normal muscle tone. He displays a negative Romberg sign. Coordination and gait normal.  Skin: Skin is warm and dry. No rash noted.  Psychiatric: He has a normal mood and affect. His behavior is normal. Judgment and thought content normal.   Palpation is tender over L pectoralis, L rhomboid areas and L lat intercostal muscles Shoulders are NT B w/ROM  Procedure: EKG Indication: chest wall pain Impression: NSR. No acute changes.   Lab Results  Component Value Date   WBC 5.2 05/16/2015   HGB 14.5 05/16/2015   HCT 43.4 05/16/2015   PLT 164.0 05/16/2015   GLUCOSE 94 05/16/2015   CHOL 151 05/16/2015   TRIG 94.0 05/16/2015   HDL 38.70 (L) 05/16/2015   LDLCALC 94 05/16/2015   ALT 14 05/16/2015   AST 16 05/16/2015   NA 140 05/16/2015   K 4.6 05/16/2015   CL 104 05/16/2015   CREATININE 1.28 05/16/2015   BUN 14 05/16/2015   CO2 30 05/16/2015   TSH 0.84 05/16/2015   PSA 0.17 05/16/2015   HGBA1C 6.1 04/09/2013    Dg Chest 2 View  Result Date: 05/16/2015 CLINICAL DATA:  One month of cough, history of asthma, COPD, former smoker. EXAM: CHEST  2 VIEW COMPARISON:  PA and lateral chest x-ray of November 22, 2006 FINDINGS: The lungs are mildly hyperinflated with hemidiaphragm flattening. There is no focal infiltrate. There is no pleural effusion. The heart and pulmonary vascularity are normal. The mediastinum is normal in width. The bony thorax exhibits no acute abnormality. IMPRESSION: Hyperinflation consistent with reactive airway disease -COPD. There is no evidence of pneumonia. Electronically Signed   By: David   [place] M.D.   On: 05/16/2015 12:29    Assessment & Plan:   There are no diagnoses linked to this encounter. I am having Kenneth [name] maintain his Vitamin D3, loratadine, Diclofenac Sodium, amoxicillin, bacitracin, ibuprofen, albuterol, atorvastatin, LORazepam, pantoprazole, scopolamine, traMADol, gabapentin, and fluticasone furoate-vilanterol.  No orders of the defined types were placed in this encounter.    Follow-up: No Follow-up on file.  Sonda PrimesAlex Tyniesha Howald, MD

## 2016-01-28 NOTE — Assessment & Plan Note (Addendum)
Since 3/17  L pectoralis, L rhomboid areas and L ribs ?MSK. Not using Lipitor EKG CXR was OK [place] chest CT May use trigger pint inj NSAIDs - Meloxicam, massage RTC 6 weeks

## 2016-01-28 NOTE — Assessment & Plan Note (Signed)
No sx's lately 

## 2016-02-20 ENCOUNTER — Ambulatory Visit (INDEPENDENT_AMBULATORY_CARE_PROVIDER_SITE_OTHER): Payer: BC Managed Care – PPO | Admitting: Internal Medicine

## 2016-02-20 ENCOUNTER — Ambulatory Visit (INDEPENDENT_AMBULATORY_CARE_PROVIDER_SITE_OTHER)
Admission: RE | Admit: 2016-02-20 | Discharge: 2016-02-20 | Disposition: A | Discharge status: Home / Self Care | Payer: BC Managed Care – PPO | Source: Ambulatory Visit | Attending: Internal Medicine | Admitting: Internal Medicine

## 2016-02-20 ENCOUNTER — Encounter: Payer: Self-pay | Admitting: Internal Medicine

## 2016-02-20 VITALS — BP 126/66 | HR 61 | Temp 98.2°F | Ht 71.0 in | Wt 229.0 lb

## 2016-02-20 DIAGNOSIS — F410 Panic disorder [episodic paroxysmal anxiety] without agoraphobia: Secondary | ICD-10-CM

## 2016-02-20 DIAGNOSIS — J4520 Mild intermittent asthma, uncomplicated: Secondary | ICD-10-CM

## 2016-02-20 DIAGNOSIS — M545 Low back pain, unspecified: Secondary | ICD-10-CM

## 2016-02-20 DIAGNOSIS — G8929 Other chronic pain: Secondary | ICD-10-CM | POA: Diagnosis not present

## 2016-02-20 DIAGNOSIS — K2100 Gastro-esophageal reflux disease with esophagitis, without bleeding: Secondary | ICD-10-CM

## 2016-02-20 DIAGNOSIS — E785 Hyperlipidemia, unspecified: Secondary | ICD-10-CM

## 2016-02-20 DIAGNOSIS — I517 Cardiomegaly: Secondary | ICD-10-CM | POA: Insufficient documentation

## 2016-02-20 MED ORDER — PANTOPRAZOLE SODIUM 40 MG PO TBEC: 40.0000 mg | DELAYED_RELEASE_TABLET | Freq: Every day | ORAL | 3 refills | Status: DC

## 2016-02-20 NOTE — Assessment & Plan Note (Signed)
Re-start Protonix 

## 2016-02-20 NOTE — Assessment & Plan Note (Signed)
Lorazepam prn 

## 2016-02-20 NOTE — Patient Instructions (Signed)
Cut back on Sprite, sweet tea

## 2016-02-20 NOTE — Progress Notes (Signed)
Pre visit review using our clinic review tool, if applicable. No additional management support is needed unless otherwise documented below in the visit note. 

## 2016-02-20 NOTE — Assessment & Plan Note (Addendum)
Using Proair only

## 2016-02-20 NOTE — Assessment & Plan Note (Signed)
LS X ray Abd [place]

## 2016-02-20 NOTE — Progress Notes (Signed)
Subjective:  [name], male    DOB: 06-22-1955  Age: 61 y.o. MRN: [id number]  CC: No chief complaint on file.   HPI Shawnn Bouillon presents for L LS spine pain x long time - better on Meloxicam. Pain is up to 3/10 at times. C/o L shoulder pain. F/u on LVH/COPD C/o GERD   Outpatient Medications Prior to Visit  Medication Sig Dispense Refill  . albuterol (PROVENTIL HFA;VENTOLIN HFA) 108 (90 Base) MCG/ACT inhaler Inhale 2 puffs into the lungs every 6 (six) hours as needed. 1 Inhaler 3  . atorvastatin (LIPITOR) 10 MG tablet Take 1 tablet (10 mg total) by mouth daily. 30 tablet 3  . bacitracin ointment Apply 1 application topically 2 (two) times daily. 15 g 0  . Cholecalciferol (VITAMIN D3) 1000 UNITS CAPS Take 1 capsule (1,000 Units total) by mouth daily. 100 capsule 3  . Diclofenac Sodium (PENNSAID) 1.5 % SOLN Place 0.3 mLs onto the skin 4 (four) times daily as needed. 150 mL 6  . fluticasone furoate-vilanterol (BREO ELLIPTA) 100-25 MCG/INH AEPB Inhale 1 puff into the lungs daily. 1 each 5  . gabapentin (NEURONTIN) 100 MG capsule Take 1 capsule (100 mg total) by mouth 3 (three) times daily. 90 capsule 5  . ibuprofen (ADVIL,MOTRIN) 600 MG tablet Take 1 tablet (600 mg total) by mouth every 6 (six) hours as needed. 30 tablet 0  . loratadine (CLARITIN) 10 MG tablet Take 1 tablet (10 mg total) by mouth daily. 90 tablet 5  . LORazepam (ATIVAN) 0.5 MG tablet TAKE 1 TO 2 TABLETS BY MOUTH TWICE A DAY 180 tablet 1  . meloxicam (MOBIC) 15 MG tablet Take 1 tablet (15 mg total) by mouth daily. Take for 2 weeks then prn 30 tablet 1  . pantoprazole (PROTONIX) 40 MG tablet Take 1 tablet (40 mg total) by mouth daily. 30 tablet 3  . scopolamine (TRANSDERM-SCOP, 1.5 MG,) 1 MG/3DAYS Place 1 patch (1.5 mg total) onto the skin every 3 (three) days. 10 patch 0  . traMADol (ULTRAM) 50 MG tablet TAKE 1 TO 2 TABLETS BY MOUTH TWICE A DAY AS NEEDED 60 tablet 3   No facility-administered medications prior to  visit.     ROS Review of Systems  Constitutional: Negative for appetite change, fatigue and unexpected weight change.  HENT: Negative for congestion, nosebleeds, sneezing, sore throat and trouble swallowing.   Eyes: Negative for itching and visual disturbance.  Respiratory: Negative for cough.   Cardiovascular: Negative for chest pain, palpitations and leg swelling.  Gastrointestinal: Negative for abdominal distention, blood in stool, diarrhea and nausea.  Genitourinary: Negative for frequency and hematuria.  Musculoskeletal: Positive for arthralgias and back pain. Negative for gait problem, joint swelling and neck pain.  Skin: Negative for rash.  Neurological: Negative for dizziness, tremors, speech difficulty and weakness.  Psychiatric/Behavioral: Negative for agitation, dysphoric mood, sleep disturbance and suicidal ideas. The patient is not nervous/anxious.     Objective:  There were no vitals taken for this visit.  BP Readings from Last 3 Encounters:  01/28/16 140/80  05/16/15 130/76  04/09/15 151/93    Wt Readings from Last 3 Encounters:  01/28/16 230 lb (104.3 kg)  05/16/15 220 lb (99.8 kg)  05/17/14 228 lb 8 oz (103.6 kg)    Physical Exam  Constitutional: He is oriented to person, place, and time. He appears well-developed. No distress.  NAD  HENT:  Mouth/Throat: Oropharynx is clear and moist.  Eyes: Conjunctivae are normal. Pupils are equal,  round, and reactive to light.  Neck: Normal range of motion. No JVD present. No thyromegaly present.  Cardiovascular: Normal rate, regular rhythm, normal heart sounds and intact distal pulses.  Exam reveals no gallop and no friction rub.   No murmur heard. Pulmonary/Chest: Effort normal and breath sounds normal. No respiratory distress. He has no wheezes. He has no rales. He exhibits no tenderness.  Abdominal: Soft. Bowel sounds are normal. He exhibits no distension and no mass. There is no tenderness. There is no rebound and  no guarding.  Musculoskeletal: Normal range of motion. He exhibits tenderness. He exhibits no edema.  Lymphadenopathy:    He has no cervical adenopathy.  Neurological: He is alert and oriented to person, place, and time. He has normal reflexes. No cranial nerve deficit. He exhibits normal muscle tone. He displays a negative Romberg sign. Coordination and gait normal.  Skin: Skin is warm and dry. No rash noted.  Psychiatric: He has a normal mood and affect. His behavior is normal. Judgment and thought content normal.    Lab Results  Component Value Date   WBC 5.2 05/16/2015   HGB 14.5 05/16/2015   HCT 43.4 05/16/2015   PLT 164.0 05/16/2015   GLUCOSE 94 05/16/2015   CHOL 151 05/16/2015   TRIG 94.0 05/16/2015   HDL 38.70 (L) 05/16/2015   LDLCALC 94 05/16/2015   ALT 14 05/16/2015   AST 16 05/16/2015   NA 140 05/16/2015   K 4.6 05/16/2015   CL 104 05/16/2015   CREATININE 1.28 05/16/2015   BUN 14 05/16/2015   CO2 30 05/16/2015   TSH 0.84 05/16/2015   PSA 0.17 05/16/2015   HGBA1C 6.1 04/09/2013    Dg Chest 2 View  Result Date: 05/16/2015 CLINICAL DATA:  One month of cough, history of asthma, COPD, former smoker. EXAM: CHEST  2 VIEW COMPARISON:  PA and lateral chest x-ray of November 22, 2006 FINDINGS: The lungs are mildly hyperinflated with hemidiaphragm flattening. There is no focal infiltrate. There is no pleural effusion. The heart and pulmonary vascularity are normal. The mediastinum is normal in width. The bony thorax exhibits no acute abnormality. IMPRESSION: Hyperinflation consistent with reactive airway disease -COPD. There is no evidence of pneumonia. Electronically Signed   By: David  SwazilandJordan M.D.   On: 05/16/2015 12:29    Assessment & Plan:   There are no diagnoses linked to this encounter. I am having Mr. Kruk maintain his Vitamin D3, loratadine, Diclofenac Sodium, bacitracin, ibuprofen, pantoprazole, fluticasone furoate-vilanterol, gabapentin, LORazepam, albuterol,  scopolamine, atorvastatin, traMADol, and meloxicam.  No orders of the defined types were placed in this encounter.    Follow-up: No Follow-up on file.  Sonda PrimesAlex [name], MD

## 2016-02-20 NOTE — Assessment & Plan Note (Signed)
2017-18 - not taking Lipitor Risks associated with treatment noncompliance were discussed. Compliance was encouraged. Cut back on Sprite, sweet tea

## 2016-02-20 NOTE — Assessment & Plan Note (Signed)
ECHO F/u w/Dr Tenny Crawoss

## 2016-03-03 ENCOUNTER — Other Ambulatory Visit (HOSPITAL_COMMUNITY): Payer: BC Managed Care – PPO

## 2016-03-08 ENCOUNTER — Encounter: Payer: Self-pay | Admitting: Internal Medicine

## 2016-03-10 ENCOUNTER — Encounter: Payer: Self-pay | Admitting: Internal Medicine

## 2016-03-17 ENCOUNTER — Ambulatory Visit (HOSPITAL_COMMUNITY): Payer: BC Managed Care – PPO | Attending: Cardiovascular Disease

## 2016-03-17 ENCOUNTER — Other Ambulatory Visit: Payer: Self-pay

## 2016-03-17 ENCOUNTER — Encounter: Payer: Self-pay | Admitting: Internal Medicine

## 2016-03-17 DIAGNOSIS — R0789 Other chest pain: Secondary | ICD-10-CM

## 2016-03-17 DIAGNOSIS — E785 Hyperlipidemia, unspecified: Secondary | ICD-10-CM | POA: Insufficient documentation

## 2016-03-17 DIAGNOSIS — Z87891 Personal history of nicotine dependence: Secondary | ICD-10-CM | POA: Diagnosis not present

## 2016-03-17 DIAGNOSIS — I517 Cardiomegaly: Secondary | ICD-10-CM

## 2016-03-17 DIAGNOSIS — J449 Chronic obstructive pulmonary disease, unspecified: Secondary | ICD-10-CM | POA: Diagnosis not present

## 2016-03-17 DIAGNOSIS — I371 Nonrheumatic pulmonary valve insufficiency: Secondary | ICD-10-CM | POA: Diagnosis not present

## 2016-03-19 ENCOUNTER — Ambulatory Visit
Admission: RE | Admit: 2016-03-19 | Discharge: 2016-03-19 | Disposition: A | Discharge status: Home / Self Care | Payer: BC Managed Care – PPO | Source: Ambulatory Visit | Attending: Internal Medicine | Admitting: Internal Medicine

## 2016-03-19 DIAGNOSIS — K2100 Gastro-esophageal reflux disease with esophagitis, without bleeding: Secondary | ICD-10-CM

## 2016-03-19 DIAGNOSIS — G8929 Other chronic pain: Secondary | ICD-10-CM

## 2016-03-19 DIAGNOSIS — M545 Low back pain, unspecified: Secondary | ICD-10-CM

## 2016-03-20 ENCOUNTER — Other Ambulatory Visit: Payer: Self-pay | Admitting: Internal Medicine

## 2016-03-22 ENCOUNTER — Encounter: Payer: Self-pay | Admitting: Internal Medicine

## 2016-03-22 ENCOUNTER — Ambulatory Visit (INDEPENDENT_AMBULATORY_CARE_PROVIDER_SITE_OTHER): Payer: BC Managed Care – PPO | Admitting: Internal Medicine

## 2016-03-22 VITALS — BP 130/78 | HR 63 | Ht 71.0 in | Wt 221.0 lb

## 2016-03-22 DIAGNOSIS — R0789 Other chest pain: Secondary | ICD-10-CM | POA: Diagnosis not present

## 2016-03-22 NOTE — Progress Notes (Signed)
Cardiology Office Note   Date:  03/22/2016   ID:  Kenneth [name], DOB 08/19/1955, MRN [id number]  PCP:  Sonda PrimesAlex [name], MD  Cardiologist:   Dietrich PatesPaula Ladona Rosten, MD   Pt referred for eval of CP   History of Present Illness: Kenneth [name] is a 61 y.o. male with a history of back pain (L sided) and L shoulder pain   Follows with A [name] Seen recently  BP was elevated   Set up for echo Echo on 1/31 LVEF normal  No signif valve abnormalities  The pt denies SOB  NO chest pressure with exertion  Has some shoulder pain, not associated with aerobic acitvity      Current Meds  Medication Sig  . albuterol (PROVENTIL HFA;VENTOLIN HFA) 108 (90 Base) MCG/ACT inhaler Inhale 2 puffs into the lungs every 6 (six) hours as needed.  Marland Kitchen. atorvastatin (LIPITOR) 10 MG tablet Take 1 tablet (10 mg total) by mouth daily.  . bacitracin ointment Apply 1 application topically 2 (two) times daily.  . Cholecalciferol (VITAMIN D3) 1000 UNITS CAPS Take 1 capsule (1,000 Units total) by mouth daily.  . Diclofenac Sodium (PENNSAID) 1.5 % SOLN Place 0.3 mLs onto the skin 4 (four) times daily as needed.  . fluticasone furoate-vilanterol (BREO ELLIPTA) 100-25 MCG/INH AEPB Inhale 1 puff into the lungs daily.  Marland Kitchen. gabapentin (NEURONTIN) 100 MG capsule Take 1 capsule (100 mg total) by mouth 3 (three) times daily.  Marland Kitchen. loratadine (CLARITIN) 10 MG tablet Take 1 tablet (10 mg total) by mouth daily.  Marland Kitchen. LORazepam (ATIVAN) 0.5 MG tablet TAKE 1 TO 2 TABLETS BY MOUTH TWICE A DAY  . meloxicam (MOBIC) 15 MG tablet Take 1 tablet (15 mg total) by mouth daily. Take for 2 weeks then prn  . pantoprazole (PROTONIX) 40 MG tablet Take 1 tablet (40 mg total) by mouth daily.  Marland Kitchen. scopolamine (TRANSDERM-SCOP, 1.5 MG,) 1 MG/3DAYS Place 1 patch (1.5 mg total) onto the skin every 3 (three) days.  . traMADol (ULTRAM) 50 MG tablet TAKE 1 TO 2 TABLETS BY MOUTH TWICE A DAY AS NEEDED     Allergies:   Aspirin   Past Medical History:  Diagnosis Date  . COPD  (chronic obstructive pulmonary disease) (HCC)   . GERD (gastroesophageal reflux disease)   . Low back pain    L4-5 disk    History reviewed. No pertinent surgical history.   Social History:  The patient  reports that he has quit smoking. He has never used smokeless tobacco. He reports that he does not drink alcohol or use drugs.   Family History:  The patient's family history includes Cancer in his father.    ROS:  Please see the history of present illness. All other systems are reviewed and  Negative to the above problem except as noted.    PHYSICAL EXAM: VS:  BP 130/78   Pulse 63   Ht 5\' 11"  (1.803 m)   Wt 221 lb (100.2 kg)   BMI 30.82 kg/m   GEN: Well nourished, well developed, in no acute distress  HEENT: normal  Neck: no JVD, carotid bruits, or masses Cardiac: RRR; no murmurs, rubs, or gallops,no edema  Respiratory:  clear to auscultation bilaterally, normal work of breathing GI: soft, nontender, nondistended, + BS  No hepatomegaly  MS: no deformity Moving all extremities   Skin: warm and dry, no rash Neuro:  Strength and sensation are intact Psych: euthymic mood, full affect   EKG:  EKG is ordered today.  SR 63 bpm with occasioonal PAC   Lipid Panel    Component Value Date/Time   CHOL 151 05/16/2015 1130   TRIG 94.0 05/16/2015 1130   HDL 38.70 (L) 05/16/2015 1130   CHOLHDL 4 05/16/2015 1130   VLDL 18.8 05/16/2015 1130   LDLCALC 94 05/16/2015 1130      Wt Readings from Last 3 Encounters:  03/22/16 221 lb (100.2 kg)  02/20/16 229 lb (103.9 kg)  01/28/16 230 lb (104.3 kg)      ASSESSMENT AND PLAN:  1  Chest / shoulder discomfort   I do not think cardiac etiology  Probably musculoskeletal. I reassured pt whicH I think is waht he wants   Overall I do not think he has any symptoms to sugg ischemia  I think he is a low cardiac risk   Review of records Lipds are good   If he wants to define risks further could consider calcium score CT  He declinies for  now  Discussed diet, exercise.  Will be available as needed  2. Blood pressure  BP is OK today    Will be available as needed if symptoms change     Current medicines are reviewed at length with the patient today.  The patient does not have concerns regarding medicines.  Signed, Dietrich Pates, MD  03/22/2016 11:09 AM    [facility] [address] [place], [place], [place]  [address] Phone: [phone]; Fax: [phone]

## 2016-03-22 NOTE — Patient Instructions (Signed)
Your physician recommends that you continue on your current medications as directed. Please refer to the Current Medication list given to you today. Your physician recommends that you schedule a follow-up appointment as needed with Dr. Ross.   

## 2016-03-29 ENCOUNTER — Encounter: Payer: Self-pay | Admitting: Internal Medicine

## 2016-05-17 ENCOUNTER — Other Ambulatory Visit: Payer: Self-pay | Admitting: Internal Medicine

## 2016-05-19 ENCOUNTER — Telehealth: Payer: Self-pay

## 2016-05-19 NOTE — Telephone Encounter (Signed)
Noted. Thx.

## 2016-05-19 NOTE — Telephone Encounter (Signed)
LVM for pt to call back as soon as possible.   RE: Cologuard order has expired. Called pt to see if he still wanted it or to let the order expire.

## 2016-05-19 NOTE — Telephone Encounter (Signed)
Called and left message advising patient that shingrix vaccine is in---patient has [employer] and should be covered---but he needs to verify with his insurance to make sure---then needs to call back to make nurse visit to get 1st injection at his convenience---2nd injection should be scheduled 2-3 months after first injection---can talk with tamara if any questions---ok to give shingrix injection per dr Posey Rea

## 2016-05-24 ENCOUNTER — Other Ambulatory Visit: Payer: Self-pay | Admitting: Internal Medicine

## 2016-06-20 ENCOUNTER — Other Ambulatory Visit: Payer: Self-pay | Admitting: Internal Medicine

## 2016-07-15 NOTE — Telephone Encounter (Signed)
Pt called regarding the vaccine, he was informed we do not have it in the office at this time. He will be calling his insurance about the coverage and will be giving the office a call back.

## 2016-07-19 ENCOUNTER — Telehealth: Payer: Self-pay | Admitting: Internal Medicine

## 2016-07-19 DIAGNOSIS — Z0000 Encounter for general adult medical examination without abnormal findings: Secondary | ICD-10-CM

## 2016-07-19 DIAGNOSIS — Z23 Encounter for immunization: Secondary | ICD-10-CM

## 2016-07-19 DIAGNOSIS — E7849 Other hyperlipidemia: Secondary | ICD-10-CM

## 2016-07-19 DIAGNOSIS — Z125 Encounter for screening for malignant neoplasm of prostate: Secondary | ICD-10-CM

## 2016-07-19 DIAGNOSIS — R030 Elevated blood-pressure reading, without diagnosis of hypertension: Secondary | ICD-10-CM

## 2016-07-19 DIAGNOSIS — R739 Hyperglycemia, unspecified: Secondary | ICD-10-CM

## 2016-07-19 NOTE — Telephone Encounter (Signed)
Pt is requesting the Shingles vaccine be sent to [facility] [address].  Pt also has an appointment on 07/30/16 and wanted to see if lab orders could be entered so that he can have them done before he comes in for the appointment.  Please advise.

## 2016-07-20 MED ORDER — ZOSTER VAC RECOMB ADJUVANTED 50 MCG/0.5ML IM SUSR: 0.5000 mL | Freq: Once | INTRAMUSCULAR | 1 refills | Status: AC

## 2016-07-20 NOTE — Addendum Note (Signed)
Addended by: Scarlett PrestoFRIEDENBACH, Einer Meals on: 07/20/2016 12:44 PM   Modules accepted: Orders

## 2016-07-20 NOTE — Telephone Encounter (Signed)
1. OK 2. CBC, BMET, LFTs, lipids, TSH, PSA, UA Thx

## 2016-07-20 NOTE — Telephone Encounter (Signed)
Place labs in epic...Raechel Chute/lmb

## 2016-07-20 NOTE — Telephone Encounter (Signed)
shingrix vaccine sent

## 2016-07-29 ENCOUNTER — Other Ambulatory Visit: Payer: Self-pay | Admitting: Internal Medicine

## 2016-07-30 ENCOUNTER — Encounter: Payer: Self-pay | Admitting: Internal Medicine

## 2016-07-30 ENCOUNTER — Ambulatory Visit (INDEPENDENT_AMBULATORY_CARE_PROVIDER_SITE_OTHER): Payer: BC Managed Care – PPO | Admitting: Internal Medicine

## 2016-07-30 ENCOUNTER — Other Ambulatory Visit (INDEPENDENT_AMBULATORY_CARE_PROVIDER_SITE_OTHER): Payer: BC Managed Care – PPO

## 2016-07-30 VITALS — BP 124/76 | HR 56 | Temp 98.9°F | Ht 71.0 in | Wt 221.0 lb

## 2016-07-30 DIAGNOSIS — M25512 Pain in left shoulder: Secondary | ICD-10-CM | POA: Diagnosis not present

## 2016-07-30 DIAGNOSIS — Z0000 Encounter for general adult medical examination without abnormal findings: Secondary | ICD-10-CM | POA: Diagnosis not present

## 2016-07-30 DIAGNOSIS — Z0001 Encounter for general adult medical examination with abnormal findings: Secondary | ICD-10-CM

## 2016-07-30 DIAGNOSIS — R739 Hyperglycemia, unspecified: Secondary | ICD-10-CM

## 2016-07-30 DIAGNOSIS — Z125 Encounter for screening for malignant neoplasm of prostate: Secondary | ICD-10-CM | POA: Diagnosis not present

## 2016-07-30 DIAGNOSIS — E784 Other hyperlipidemia: Secondary | ICD-10-CM | POA: Diagnosis not present

## 2016-07-30 DIAGNOSIS — E785 Hyperlipidemia, unspecified: Secondary | ICD-10-CM | POA: Diagnosis not present

## 2016-07-30 DIAGNOSIS — F411 Generalized anxiety disorder: Secondary | ICD-10-CM | POA: Diagnosis not present

## 2016-07-30 DIAGNOSIS — R030 Elevated blood-pressure reading, without diagnosis of hypertension: Secondary | ICD-10-CM

## 2016-07-30 DIAGNOSIS — M25511 Pain in right shoulder: Secondary | ICD-10-CM

## 2016-07-30 DIAGNOSIS — E7849 Other hyperlipidemia: Secondary | ICD-10-CM

## 2016-07-30 LAB — HEPATIC FUNCTION PANEL
ALK PHOS: 45 U/L (ref 39–117)
ALT: 13 U/L (ref 0–53)
AST: 14 U/L (ref 0–37)
Albumin: 4.6 g/dL (ref 3.5–5.2)
BILIRUBIN DIRECT: 0.1 mg/dL (ref 0.0–0.3)
TOTAL PROTEIN: 7.1 g/dL (ref 6.0–8.3)
Total Bilirubin: 0.5 mg/dL (ref 0.2–1.2)

## 2016-07-30 LAB — CBC WITH DIFFERENTIAL/PLATELET
Basophils Absolute: 0 10*3/uL (ref 0.0–0.1)
Basophils Relative: 1.1 % (ref 0.0–3.0)
EOS ABS: 0.1 10*3/uL (ref 0.0–0.7)
EOS PCT: 1.5 % (ref 0.0–5.0)
HCT: 44.3 % (ref 39.0–52.0)
HEMOGLOBIN: 14.5 g/dL (ref 13.0–17.0)
LYMPHS ABS: 1.5 10*3/uL (ref 0.7–4.0)
Lymphocytes Relative: 35.9 % (ref 12.0–46.0)
MCHC: 32.7 g/dL (ref 30.0–36.0)
MCV: 91.9 fl (ref 78.0–100.0)
MONO ABS: 0.3 10*3/uL (ref 0.1–1.0)
Monocytes Relative: 8.2 % (ref 3.0–12.0)
NEUTROS PCT: 53.3 % (ref 43.0–77.0)
Neutro Abs: 2.2 10*3/uL (ref 1.4–7.7)
Platelets: 181 10*3/uL (ref 150.0–400.0)
RBC: 4.82 Mil/uL (ref 4.22–5.81)
RDW: 12.9 % (ref 11.5–15.5)
WBC: 4.2 10*3/uL (ref 4.0–10.5)

## 2016-07-30 LAB — URINALYSIS, ROUTINE W REFLEX MICROSCOPIC
Bilirubin Urine: NEGATIVE
Ketones, ur: NEGATIVE
Leukocytes, UA: NEGATIVE
NITRITE: NEGATIVE
SPECIFIC GRAVITY, URINE: 1.02 (ref 1.000–1.030)
TOTAL PROTEIN, URINE-UPE24: NEGATIVE
URINE GLUCOSE: NEGATIVE
Urobilinogen, UA: 0.2 (ref 0.0–1.0)
WBC, UA: NONE SEEN (ref 0–?)
pH: 6 (ref 5.0–8.0)

## 2016-07-30 LAB — LIPID PANEL
CHOL/HDL RATIO: 3
Cholesterol: 119 mg/dL (ref 0–200)
HDL: 37.7 mg/dL — AB (ref >39.00)
LDL Cholesterol: 65 mg/dL (ref 0–99)
NONHDL: 81.02
Triglycerides: 78 mg/dL (ref 0.0–149.0)
VLDL: 15.6 mg/dL (ref 0.0–40.0)

## 2016-07-30 LAB — PSA: PSA: 0.22 ng/mL (ref 0.10–4.00)

## 2016-07-30 LAB — BASIC METABOLIC PANEL
BUN: 10 mg/dL (ref 6–23)
CALCIUM: 9.7 mg/dL (ref 8.4–10.5)
CO2: 30 meq/L (ref 19–32)
Chloride: 107 mEq/L (ref 96–112)
Creatinine, Ser: 1.26 mg/dL (ref 0.40–1.50)
GFR: 61.35 mL/min (ref >60.00)
GLUCOSE: 97 mg/dL (ref 70–99)
POTASSIUM: 4.6 meq/L (ref 3.5–5.1)
SODIUM: 143 meq/L (ref 135–145)

## 2016-07-30 LAB — TSH: TSH: 0.92 u[IU]/mL (ref 0.35–4.50)

## 2016-07-30 MED ORDER — MELOXICAM 15 MG PO TABS: ORAL_TABLET | ORAL | 1 refills | Status: DC

## 2016-07-30 MED ORDER — ALBUTEROL SULFATE HFA 108 (90 BASE) MCG/ACT IN AERS: 2.0000 | INHALATION_SPRAY | Freq: Four times a day (QID) | RESPIRATORY_TRACT | 3 refills | Status: DC | PRN

## 2016-07-30 MED ORDER — TRAMADOL HCL 50 MG PO TABS: ORAL_TABLET | ORAL | 3 refills | Status: DC

## 2016-07-30 MED ORDER — FLUTICASONE FUROATE-VILANTEROL 100-25 MCG/INH IN AEPB: 1.0000 | INHALATION_SPRAY | Freq: Every day | RESPIRATORY_TRACT | 5 refills | Status: DC

## 2016-07-30 MED ORDER — PANTOPRAZOLE SODIUM 40 MG PO TBEC: 40.0000 mg | DELAYED_RELEASE_TABLET | Freq: Every day | ORAL | 3 refills | Status: DC

## 2016-07-30 MED ORDER — SCOPOLAMINE 1 MG/3DAYS TD PT72
1.0000 | MEDICATED_PATCH | TRANSDERMAL | 0 refills | Status: DC
Start: 2016-07-30 — End: 2017-08-05

## 2016-07-30 MED ORDER — LORAZEPAM 0.5 MG PO TABS: ORAL_TABLET | ORAL | 1 refills | Status: DC

## 2016-07-30 MED ORDER — ATORVASTATIN CALCIUM 10 MG PO TABS: 10.0000 mg | ORAL_TABLET | Freq: Every day | ORAL | 3 refills | Status: DC

## 2016-07-30 NOTE — Assessment & Plan Note (Signed)
Lipitor 

## 2016-07-30 NOTE — Assessment & Plan Note (Signed)
Lorazepam prn  Potential benefits of a long term benzodiazepines  use as well as potential risks  and complications were explained to the patient and were aknowledged.  

## 2016-07-30 NOTE — Assessment & Plan Note (Signed)
chronic L CP, L shoulder pain, irrad pain to the L and R UE x months. He had a cardio w/u. He had to stop playing golf. Sports Med ref

## 2016-07-30 NOTE — Assessment & Plan Note (Signed)
BP Readings from Last 3 Encounters:  07/30/16 124/76  03/22/16 130/78  02/20/16 126/66

## 2016-07-30 NOTE — Assessment & Plan Note (Signed)
We discussed age appropriate health related issues, including available/recomended screening tests and vaccinations. We discussed a need for adhering to healthy diet and exercise. Labs were ordered to be later reviewed . All questions were answered.   

## 2016-07-30 NOTE — Progress Notes (Signed)
Subjective:  [name], male    DOB: 1955/08/12  Age: 61 y.o. MRN: [id number]  CC: No chief complaint on file.   HPI Kenneth [name] presents for a well exam C/o chronic L CP, L shoulder pain, irrad pain to the L and R UE x months. He had a cardio w/u. He had to stop playing golf.  Outpatient Medications Prior to Visit  Medication Sig Dispense Refill  . albuterol (PROVENTIL HFA;VENTOLIN HFA) 108 (90 Base) MCG/ACT inhaler Inhale 2 puffs into the lungs every 6 (six) hours as needed. 1 Inhaler 3  . atorvastatin (LIPITOR) 10 MG tablet Take 1 tablet (10 mg total) by mouth daily. Overdue for yearly physical w/labs must see MD for refills 30 tablet 0  . bacitracin ointment Apply 1 application topically 2 (two) times daily. 15 g 0  . Cholecalciferol (VITAMIN D3) 1000 UNITS CAPS Take 1 capsule (1,000 Units total) by mouth daily. 100 capsule 3  . Diclofenac Sodium (PENNSAID) 1.5 % SOLN Place 0.3 mLs onto the skin 4 (four) times daily as needed. 150 mL 6  . fluticasone furoate-vilanterol (BREO ELLIPTA) 100-25 MCG/INH AEPB Inhale 1 puff into the lungs daily. 1 each 5  . gabapentin (NEURONTIN) 100 MG capsule Take 1 capsule (100 mg total) by mouth 3 (three) times daily. 90 capsule 5  . loratadine (CLARITIN) 10 MG tablet Take 1 tablet (10 mg total) by mouth daily. 90 tablet 5  . LORazepam (ATIVAN) 0.5 MG tablet TAKE 1 TO 2 TABLETS BY MOUTH TWICE A DAY 180 tablet 1  . meloxicam (MOBIC) 15 MG tablet TAKE 1 TABLET BY MOUTH ONCE DAILY WITH FOOD FOR 2 WEEKS THEN AS NEEDED 30 tablet 1  . pantoprazole (PROTONIX) 40 MG tablet Take 1 tablet (40 mg total) by mouth daily. Overdue for annual appt w/labs must see MD for refills 30 tablet 0  . scopolamine (TRANSDERM-SCOP, 1.5 MG,) 1 MG/3DAYS Place 1 patch (1.5 mg total) onto the skin every 3 (three) days. 10 patch 0  . traMADol (ULTRAM) 50 MG tablet TAKE 1 TO 2 TABLETS BY MOUTH TWICE A DAY AS NEEDED 60 tablet 3   No facility-administered medications prior to  visit.     ROS Review of Systems  Constitutional: Negative for appetite change, fatigue and unexpected weight change.  HENT: Negative for congestion, nosebleeds, sneezing, sore throat and trouble swallowing.   Eyes: Negative for itching and visual disturbance.  Respiratory: Negative for cough.   Cardiovascular: Positive for chest pain. Negative for palpitations and leg swelling.  Gastrointestinal: Negative for abdominal distention, blood in stool, diarrhea and nausea.  Genitourinary: Negative for frequency and hematuria.  Musculoskeletal: Positive for arthralgias. Negative for back pain, gait problem, joint swelling and neck pain.  Skin: Negative for rash.  Neurological: Negative for dizziness, tremors, speech difficulty and weakness.  Psychiatric/Behavioral: Negative for agitation, dysphoric mood, sleep disturbance and suicidal ideas. The patient is not nervous/anxious.     Objective:  BP 124/76 (BP Location: Left Arm, Patient Position: Sitting, Cuff Size: Large)   Pulse (!) 56   Temp 98.9 F (37.2 C) (Oral)   Ht 5\' 11"  (1.803 m)   Wt 221 lb (100.2 kg)   SpO2 98%   BMI 30.82 kg/m   BP Readings from Last 3 Encounters:  07/30/16 124/76  03/22/16 130/78  02/20/16 126/66    Wt Readings from Last 3 Encounters:  07/30/16 221 lb (100.2 kg)  03/22/16 221 lb (100.2 kg)  02/20/16 229 lb (103.9 kg)  Physical Exam  Constitutional: He is oriented to person, place, and time. He appears well-developed. No distress.  NAD  HENT:  Mouth/Throat: Oropharynx is clear and moist.  Eyes: Conjunctivae are normal. Pupils are equal, round, and reactive to light.  Neck: Normal range of motion. No JVD present. No thyromegaly present.  Cardiovascular: Normal rate, regular rhythm, normal heart sounds and intact distal pulses.  Exam reveals no gallop and no friction rub.   No murmur heard. Pulmonary/Chest: Effort normal and breath sounds normal. No respiratory distress. He has no wheezes. He has  no rales. He exhibits no tenderness.  Abdominal: Soft. Bowel sounds are normal. He exhibits no distension and no mass. There is no tenderness. There is no rebound and no guarding.  Genitourinary: Rectum normal and prostate normal. Rectal exam shows guaiac negative stool.  Musculoskeletal: Normal range of motion. He exhibits no edema or tenderness.  Lymphadenopathy:    He has no cervical adenopathy.  Neurological: He is alert and oriented to person, place, and time. He has normal reflexes. No cranial nerve deficit. He exhibits normal muscle tone. He displays a negative Romberg sign. Coordination and gait normal.  Skin: Skin is warm and dry. No rash noted.  Psychiatric: He has a normal mood and affect. His behavior is normal. Judgment and thought content normal.    Lab Results  Component Value Date   WBC 5.2 05/16/2015   HGB 14.5 05/16/2015   HCT 43.4 05/16/2015   PLT 164.0 05/16/2015   GLUCOSE 94 05/16/2015   CHOL 151 05/16/2015   TRIG 94.0 05/16/2015   HDL 38.70 (L) 05/16/2015   LDLCALC 94 05/16/2015   ALT 14 05/16/2015   AST 16 05/16/2015   NA 140 05/16/2015   K 4.6 05/16/2015   CL 104 05/16/2015   CREATININE 1.28 05/16/2015   BUN 14 05/16/2015   CO2 30 05/16/2015   TSH 0.84 05/16/2015   PSA 0.17 05/16/2015   HGBA1C 6.1 04/09/2013    [place] Abdomen Complete  Result Date: 03/19/2016 CLINICAL DATA:  Left upper quadrant and left flank pain. EXAM: ABDOMEN ULTRASOUND COMPLETE COMPARISON:  Lumbar spine 02/20/2016. FINDINGS: Gallbladder: No gallstones or wall thickening visualized. No sonographic Murphy sign noted by sonographer. Common bile duct: Diameter: 2.6 mm Liver: Liver is slightly echogenic suggesting fatty infiltration and/or hepatocellular disease. No focal hepatic abnormality identified. IVC: No abnormality visualized. Pancreas: Visualized portion unremarkable. Spleen: Size and appearance within normal limits. Right Kidney: Length: 10.0 cm. Echogenicity within normal limits. No  mass or hydronephrosis visualized. Left Kidney: Length: 10.2 cm. Echogenicity within normal limits. No mass or hydronephrosis visualized. Abdominal aorta: No aneurysm visualized. Other findings: None. IMPRESSION: 1. Liver is slightly echogenic suggesting fatty infiltration and/or hepatocellular disease. 2.  No acute or focal abnormality. Electronically Signed   By: Maisie Fushomas  Register   On: 03/19/2016 10:24    Assessment & Plan:   There are no diagnoses linked to this encounter. I am having Mr. Carbonell maintain his Vitamin D3, loratadine, Diclofenac Sodium, bacitracin, fluticasone furoate-vilanterol, gabapentin, LORazepam, albuterol, scopolamine, traMADol, meloxicam, atorvastatin, and pantoprazole.  No orders of the defined types were placed in this encounter.    Follow-up: No Follow-up on file.  Sonda PrimesAlex Tedford Berg, MD

## 2016-08-22 NOTE — Progress Notes (Signed)
[name] [address] [place], [place] [address] Phone: [phone] Subjective:    I'm seeing this patient by the request  of:  [name], Kenneth Quint, MD   CC: Bilateral shoulder pain  BJY:[id number]  Kenneth [name] is a 61 y.o. male coming in with complaint of bilateral shoulder pain. Worse on the left Patient states it is a dull throbbing aching pain with some radiation down the left arm. Rates the severity pain is 8 out of 10. Patient denies any sometimes very uncomfortable at night. When he does take ibuprofen it does seem to help. Does not remember any true injury.   . No x-rays of patient's shoulder. Patient does have x-rays of his neck from January 2013. These were independently visualized by me showing degenerative disc disease at multiple levels.   Past Medical History:  Diagnosis Date  . COPD (chronic obstructive pulmonary disease) (HCC)   . GERD (gastroesophageal reflux disease)   . Low back pain    L4-5 disk   No past surgical history on file. Social History   Social History  . Marital status: Married    Spouse name: N/A  . Number of children: N/A  . Years of education: N/A   Social History Main Topics  . Smoking status: Former Games developer  . Smokeless tobacco: Never Used  . Alcohol use No  . Drug use: No  . Sexual activity: Yes   Other Topics Concern  . Not on file   Social History Narrative  . No narrative on file   Allergies  Allergen Reactions  . Aspirin     REACTION: upset stomach - not bad He can take NSAIDS   Family History  Problem Relation Age of Onset  . Cancer Father        lymphoma    Past medical history, social, surgical and family history all reviewed in electronic medical record.  No pertanent information unless stated regarding to the chief complaint.   Review of Systems:Review of systems updated and as accurate as of 08/22/16  No headache, visual changes, nausea, vomiting, diarrhea, constipation,  dizziness, abdominal pain, skin rash, fevers, chills, night sweats, weight loss, swollen lymph nodes, body aches, joint swelling, muscle aches, chest pain, shortness of breath, mood changes.   Objective  There were no vitals taken for this visit. Systems examined below as of 08/22/16   General: No apparent distress alert and oriented x3 mood and affect normal, dressed appropriately.  HEENT: Pupils equal, extraocular movements intact  Respiratory: Patient's speak in full sentences and does not appear short of breath  Cardiovascular: No lower extremity edema, non tender, no erythema  Skin: Warm dry intact with no signs of infection or rash on extremities or on axial skeleton.  Abdomen: Soft nontender  Neuro: Cranial nerves II through XII are intact, neurovascularly intact in all extremities with 2+ DTRs and 2+ pulses.  Lymph: No lymphadenopathy of posterior or anterior cervical chain or axillae bilaterally.  Gait normal with good balance and coordination.  MSK:  Non tender with full range of motion and good stability and symmetric strength and tone of  elbows, wrist, hip, knee and ankles bilaterally.   Neck: Inspection unremarkable. No palpable stepoffs. Positive Spurling's on the left severe decrease in ring the last 10 of rotation bilaterally and on minidebending on he left signand lacks last 10 of extension Grip strength and sensation normal in bilateral hands Strength good C4 to T1 distribution No sensory change to C4  to T1 Negative Hoffman sign bilaterally Reflexes normal Shoulder: left Inspection reveals no abnormalities, atrophy or asymmetry. Palpation is normal with no tenderness over AC joint or bicipital groove. ROM is full in all planes passively. Rotator cuff strength normal throughout. signs of impingement with positive Neer and Hawkin's tests, but negative empty can sign. Speeds and Yergason's tests normal. No labral pathology noted with negative Obrien's, negative  clunk and good stability. Normal scapular function observed. No painful arc and no drop arm sign. No apprehension sign  MSK US performed of: left This study was ordered, performed, and interpreted by Terrilee FilesZach Tami Barren D.O.  Shoulder:   Supraspinatus:  Appears normal on long and transverse views, Bursal bulge seen with shoulder abduction on impingement view. Infraspinatus:  Appears normal on long and transverse views. Significant increase in Doppler flow Subscapularis:  Appears normal on long and transverse views. Positive bursa Teres Minor:  Appears normal on long and transverse views. AC joint:  Capsule undistended, no geyser sign. Glenohumeral Joint:  Appears normal without effusion. Glenoid Labrum:  Intact without visualized tears. Biceps Tendon:  Appears normal on long and transverse views, no fraying of tendon, tendon located in intertubercular groove, no subluxation with shoulder internal or external rotation.  Impression: Subacromial bursitis rotator cuff intact  Osteopathic findings C2 flexed rotated and side bent right C4 flexed rotated and side bent left C6 flexed rotated and side bent left T3 extended rotated and side bent right inhaled third rib T9 extended rotated and side bent left     Impression and Recommendations:     This case required medical decision making of moderate complexity.      Note: This dictation was prepared with Dragon dictation along with smaller phrase technology. Any transcriptional errors that result from this process are unintentional.

## 2016-08-23 ENCOUNTER — Ambulatory Visit (INDEPENDENT_AMBULATORY_CARE_PROVIDER_SITE_OTHER)
Admission: RE | Admit: 2016-08-23 | Discharge: 2016-08-23 | Disposition: A | Discharge status: Home / Self Care | Payer: BC Managed Care – PPO | Source: Ambulatory Visit | Attending: Family Medicine | Admitting: Family Medicine

## 2016-08-23 ENCOUNTER — Encounter: Payer: Self-pay | Admitting: Family Medicine

## 2016-08-23 ENCOUNTER — Ambulatory Visit (INDEPENDENT_AMBULATORY_CARE_PROVIDER_SITE_OTHER): Payer: BC Managed Care – PPO | Admitting: Family Medicine

## 2016-08-23 ENCOUNTER — Ambulatory Visit: Payer: Self-pay

## 2016-08-23 VITALS — BP 130/82 | HR 65 | Ht 71.0 in | Wt 220.0 lb

## 2016-08-23 DIAGNOSIS — G8929 Other chronic pain: Secondary | ICD-10-CM

## 2016-08-23 DIAGNOSIS — M25512 Pain in left shoulder: Secondary | ICD-10-CM | POA: Diagnosis not present

## 2016-08-23 DIAGNOSIS — M999 Biomechanical lesion, unspecified: Secondary | ICD-10-CM | POA: Diagnosis not present

## 2016-08-23 DIAGNOSIS — M5412 Radiculopathy, cervical region: Secondary | ICD-10-CM | POA: Diagnosis not present

## 2016-08-23 DIAGNOSIS — M7552 Bursitis of left shoulder: Secondary | ICD-10-CM

## 2016-08-23 MED ORDER — VITAMIN D (ERGOCALCIFEROL) 1.25 MG (50000 UNIT) PO CAPS: 50000.0000 [IU] | ORAL_CAPSULE | ORAL | 0 refills | Status: DC

## 2016-08-23 MED ORDER — GABAPENTIN 100 MG PO CAPS: 200.0000 mg | ORAL_CAPSULE | Freq: Every day | ORAL | 3 refills | Status: DC

## 2016-08-23 NOTE — Assessment & Plan Note (Addendum)
Decision today to treat with OMT was based on Physical Exam  After verbal consent patient was treated with  ME, FPR techniques in cervical, thoracic, rib areas  Patient tolerated the procedure well with improvement in symptoms  Patient given exercises, stretches and lifestyle modifications  See medications in patient instructions if given  Patient will follow up in 3-4 weeks

## 2016-08-23 NOTE — Assessment & Plan Note (Signed)
Mild.

## 2016-08-23 NOTE — Assessment & Plan Note (Signed)
Patient has what appears to be more of a cervical radiculopathy. X-rays ordered today. Patient shoulder show some very mild bursitis. Patient has meloxicam for breakthrough pain. Started on once weekly vitamin D. Attempted osteopathic manipulation. We discussed which activities are doing which ones to avoid. Home exercises given. Has tramadol for breakthrough pain. Follow-up again in 4 weeks.

## 2016-08-23 NOTE — Patient Instructions (Signed)
Good to see you  Xray today downstairs Ice 20 minutes 2 times daily. Usually after activity and before bed. Exercises 3 times a week.  pennsaid pinkie amount topically 2 times daily as needed.  Gabapentin 200mg  at night Once weekly vitamin D for 12 weeks for muscle strength and endurance.  Turmeric 500mg  2 times a day On wall with heels, butt shoulder and head touching for a goal of 5 minutes daily  See me again in 4-6 weeks

## 2016-08-27 ENCOUNTER — Encounter: Payer: Self-pay | Admitting: Family Medicine

## 2016-08-27 ENCOUNTER — Encounter: Payer: Self-pay | Admitting: Internal Medicine

## 2016-08-27 MED ORDER — DICLOFENAC SODIUM 2 % TD SOLN: 2.0000 "application " | Freq: Two times a day (BID) | TRANSDERMAL | 3 refills | Status: DC

## 2016-08-30 ENCOUNTER — Other Ambulatory Visit: Payer: Self-pay

## 2016-08-30 MED ORDER — DICLOFENAC SODIUM 2 % TD SOLN: 2.0000 "application " | Freq: Two times a day (BID) | TRANSDERMAL | 3 refills | Status: AC

## 2016-09-13 ENCOUNTER — Encounter: Payer: Self-pay | Admitting: Family Medicine

## 2016-09-27 ENCOUNTER — Ambulatory Visit: Payer: BC Managed Care – PPO | Admitting: Family Medicine

## 2016-11-15 ENCOUNTER — Other Ambulatory Visit: Payer: Self-pay | Admitting: Family Medicine

## 2016-11-15 NOTE — Telephone Encounter (Signed)
Refill done.  

## 2016-11-20 ENCOUNTER — Encounter: Payer: Self-pay | Admitting: Internal Medicine

## 2016-11-21 ENCOUNTER — Other Ambulatory Visit: Payer: Self-pay | Admitting: Internal Medicine

## 2016-11-23 NOTE — Telephone Encounter (Signed)
LOV 07/30/2016. Please advise on refill

## 2016-11-24 NOTE — Telephone Encounter (Signed)
Faxed script back to [facility] pharmacy...Raechel Chute

## 2016-11-27 ENCOUNTER — Ambulatory Visit: Payer: BC Managed Care – PPO

## 2017-01-31 ENCOUNTER — Ambulatory Visit: Payer: BC Managed Care – PPO | Admitting: Internal Medicine

## 2017-02-12 ENCOUNTER — Other Ambulatory Visit: Payer: Self-pay | Admitting: Family Medicine

## 2017-05-14 ENCOUNTER — Other Ambulatory Visit: Payer: Self-pay | Admitting: Family Medicine

## 2017-05-20 ENCOUNTER — Other Ambulatory Visit: Payer: Self-pay | Admitting: Internal Medicine

## 2017-06-02 ENCOUNTER — Telehealth: Payer: Self-pay

## 2017-06-02 NOTE — Telephone Encounter (Signed)
Key: [id number] - [place] Case ID: [id number] - Rx #: [id number]  Tramadol approved today via Cover My Meds.

## 2017-08-05 ENCOUNTER — Other Ambulatory Visit (INDEPENDENT_AMBULATORY_CARE_PROVIDER_SITE_OTHER): Payer: BC Managed Care – PPO

## 2017-08-05 ENCOUNTER — Encounter: Payer: BC Managed Care – PPO | Admitting: Internal Medicine

## 2017-08-05 ENCOUNTER — Encounter: Payer: Self-pay | Admitting: Internal Medicine

## 2017-08-05 ENCOUNTER — Ambulatory Visit (INDEPENDENT_AMBULATORY_CARE_PROVIDER_SITE_OTHER): Payer: BC Managed Care – PPO | Admitting: Internal Medicine

## 2017-08-05 ENCOUNTER — Ambulatory Visit (INDEPENDENT_AMBULATORY_CARE_PROVIDER_SITE_OTHER)
Admission: RE | Admit: 2017-08-05 | Discharge: 2017-08-05 | Disposition: A | Discharge status: Home / Self Care | Payer: BC Managed Care – PPO | Source: Ambulatory Visit | Attending: Internal Medicine | Admitting: Internal Medicine

## 2017-08-05 VITALS — BP 130/84 | HR 67 | Ht 71.0 in | Wt 224.0 lb

## 2017-08-05 DIAGNOSIS — Z0000 Encounter for general adult medical examination without abnormal findings: Secondary | ICD-10-CM

## 2017-08-05 DIAGNOSIS — F411 Generalized anxiety disorder: Secondary | ICD-10-CM

## 2017-08-05 DIAGNOSIS — R05 Cough: Secondary | ICD-10-CM | POA: Diagnosis not present

## 2017-08-05 DIAGNOSIS — J4520 Mild intermittent asthma, uncomplicated: Secondary | ICD-10-CM

## 2017-08-05 DIAGNOSIS — R0789 Other chest pain: Secondary | ICD-10-CM | POA: Diagnosis not present

## 2017-08-05 DIAGNOSIS — M545 Low back pain, unspecified: Secondary | ICD-10-CM

## 2017-08-05 DIAGNOSIS — J310 Chronic rhinitis: Secondary | ICD-10-CM

## 2017-08-05 DIAGNOSIS — R059 Cough, unspecified: Secondary | ICD-10-CM

## 2017-08-05 DIAGNOSIS — G8929 Other chronic pain: Secondary | ICD-10-CM

## 2017-08-05 DIAGNOSIS — R21 Rash and other nonspecific skin eruption: Secondary | ICD-10-CM | POA: Insufficient documentation

## 2017-08-05 LAB — CBC WITH DIFFERENTIAL/PLATELET
Basophils Absolute: 0.1 10*3/uL (ref 0.0–0.1)
Basophils Relative: 1 % (ref 0.0–3.0)
Eosinophils Absolute: 0.1 10*3/uL (ref 0.0–0.7)
Eosinophils Relative: 0.9 % (ref 0.0–5.0)
HCT: 43.4 % (ref 39.0–52.0)
HEMOGLOBIN: 14.6 g/dL (ref 13.0–17.0)
Lymphocytes Relative: 27.9 % (ref 12.0–46.0)
Lymphs Abs: 1.6 10*3/uL (ref 0.7–4.0)
MCHC: 33.8 g/dL (ref 30.0–36.0)
MCV: 91.6 fl (ref 78.0–100.0)
MONO ABS: 0.4 10*3/uL (ref 0.1–1.0)
Monocytes Relative: 6.9 % (ref 3.0–12.0)
Neutro Abs: 3.6 10*3/uL (ref 1.4–7.7)
Neutrophils Relative %: 63.3 % (ref 43.0–77.0)
Platelets: 178 10*3/uL (ref 150.0–400.0)
RBC: 4.73 Mil/uL (ref 4.22–5.81)
RDW: 13.3 % (ref 11.5–15.5)
WBC: 5.6 10*3/uL (ref 4.0–10.5)

## 2017-08-05 LAB — LIPID PANEL
Cholesterol: 125 mg/dL (ref 0–200)
HDL: 41.3 mg/dL (ref >39.00)
LDL CALC: 68 mg/dL (ref 0–99)
NonHDL: 83.86
Total CHOL/HDL Ratio: 3
Triglycerides: 81 mg/dL (ref 0.0–149.0)
VLDL: 16.2 mg/dL (ref 0.0–40.0)

## 2017-08-05 LAB — URINALYSIS
BILIRUBIN URINE: NEGATIVE
HGB URINE DIPSTICK: NEGATIVE
Ketones, ur: NEGATIVE
LEUKOCYTES UA: NEGATIVE
NITRITE: NEGATIVE
Specific Gravity, Urine: 1.015 (ref 1.000–1.030)
TOTAL PROTEIN, URINE-UPE24: NEGATIVE
Urine Glucose: NEGATIVE
Urobilinogen, UA: 0.2 (ref 0.0–1.0)
pH: 7.5 (ref 5.0–8.0)

## 2017-08-05 LAB — TSH: TSH: 0.7 u[IU]/mL (ref 0.35–4.50)

## 2017-08-05 LAB — BASIC METABOLIC PANEL
BUN: 8 mg/dL (ref 6–23)
CALCIUM: 9.3 mg/dL (ref 8.4–10.5)
CO2: 27 mEq/L (ref 19–32)
Chloride: 106 mEq/L (ref 96–112)
Creatinine, Ser: 1.19 mg/dL (ref 0.40–1.50)
GFR: 65.32 mL/min (ref >60.00)
Glucose, Bld: 90 mg/dL (ref 70–99)
Potassium: 4.4 mEq/L (ref 3.5–5.1)
SODIUM: 141 meq/L (ref 135–145)

## 2017-08-05 LAB — HEPATIC FUNCTION PANEL
ALT: 18 U/L (ref 0–53)
AST: 15 U/L (ref 0–37)
Albumin: 4.6 g/dL (ref 3.5–5.2)
Alkaline Phosphatase: 43 U/L (ref 39–117)
Bilirubin, Direct: 0.1 mg/dL (ref 0.0–0.3)
Total Bilirubin: 0.5 mg/dL (ref 0.2–1.2)
Total Protein: 7.1 g/dL (ref 6.0–8.3)

## 2017-08-05 LAB — PSA: PSA: 0.16 ng/mL (ref 0.10–4.00)

## 2017-08-05 MED ORDER — PANTOPRAZOLE SODIUM 40 MG PO TBEC: 40.0000 mg | DELAYED_RELEASE_TABLET | Freq: Every day | ORAL | 3 refills | Status: DC

## 2017-08-05 MED ORDER — ATORVASTATIN CALCIUM 10 MG PO TABS
10.0000 mg | ORAL_TABLET | Freq: Every day | ORAL | 3 refills | Status: DC
Start: 2017-08-05 — End: 2018-03-01

## 2017-08-05 MED ORDER — LORAZEPAM 0.5 MG PO TABS: ORAL_TABLET | ORAL | 1 refills | Status: DC

## 2017-08-05 MED ORDER — ALBUTEROL SULFATE HFA 108 (90 BASE) MCG/ACT IN AERS: 2.0000 | INHALATION_SPRAY | Freq: Four times a day (QID) | RESPIRATORY_TRACT | 6 refills | Status: DC | PRN

## 2017-08-05 MED ORDER — SCOPOLAMINE 1 MG/3DAYS TD PT72: 1.0000 | MEDICATED_PATCH | TRANSDERMAL | 0 refills | Status: DC

## 2017-08-05 MED ORDER — TRAMADOL HCL 50 MG PO TABS: ORAL_TABLET | ORAL | 3 refills | Status: DC

## 2017-08-05 NOTE — Assessment & Plan Note (Signed)
Lorazepam prn  Potential benefits of a long term benzodiazepines  use as well as potential risks  and complications were explained to the patient and were aknowledged.  

## 2017-08-05 NOTE — Assessment & Plan Note (Signed)
MSK  

## 2017-08-05 NOTE — Patient Instructions (Signed)
Costochondritis Costochondritis is swelling and irritation (inflammation) of the tissue (cartilage) that connects your ribs to your breastbone (sternum). This causes pain in the front of your chest. The pain usually starts gradually and involves more than one rib. What are the causes? The exact cause of this condition is not always known. It results from stress on the cartilage where your ribs attach to your sternum. The cause of this stress could be:  Chest injury (trauma).  Exercise or activity, such as lifting.  Severe coughing.  What increases the risk? You may be at higher risk for this condition if you:  Are male.  Are 30?64 years old.  Recently started a new exercise or work activity.  Have low levels of vitamin D.  Have a condition that makes you cough frequently.  What are the signs or symptoms? The main symptom of this condition is chest pain. The pain:  Usually starts gradually and can be sharp or dull.  Gets worse with deep breathing, coughing, or exercise.  Gets better with rest.  May be worse when you press on the sternum-rib connection (tenderness).  How is this diagnosed? This condition is diagnosed based on your symptoms, medical history, and a physical exam. Your health care provider will check for tenderness when pressing on your sternum. This is the most important finding. You may also have tests to rule out other causes of chest pain. These may include:  A chest X-ray to check for lung problems.  An electrocardiogram (ECG) to see if you have a heart problem that could be causing the pain.  An imaging scan to rule out a chest or rib fracture.  How is this treated? This condition usually goes away on its own over time. Your health care provider may prescribe an NSAID to reduce pain and inflammation. Your health care provider may also suggest that you:  Rest and avoid activities that make pain worse.  Apply heat or cold to the area to reduce pain  and inflammation.  Do exercises to stretch your chest muscles.  If these treatments do not help, your health care provider may inject a numbing medicine at the sternum-rib connection to help relieve the pain. Follow these instructions at home:  Avoid activities that make pain worse. This includes any activities that use chest, abdominal, and side muscles.  If directed, put ice on the painful area: ? Put ice in a plastic bag. ? Place a towel between your skin and the bag. ? Leave the ice on for 20 minutes, 2-3 times a day.  If directed, apply heat to the affected area as often as told by your health care provider. Use the heat source that your health care provider recommends, such as a moist heat pack or a heating pad. ? Place a towel between your skin and the heat source. ? Leave the heat on for 20-30 minutes. ? Remove the heat if your skin turns bright red. This is especially important if you are unable to feel pain, heat, or cold. You may have a greater risk of getting burned.  Take over-the-counter and prescription medicines only as told by your health care provider.  Return to your normal activities as told by your health care provider. Ask your health care provider what activities are safe for you.  Keep all follow-up visits as told by your health care provider. This is important. Contact a health care provider if:  You have chills or a fever.  Your pain does not go   away or it gets worse.  You have a cough that does not go away (is persistent). Get help right away if:  You have shortness of breath. This information is not intended to replace advice given to you by your health care provider. Make sure you discuss any questions you have with your health care provider. Document Released: 11/11/2004 Document Revised: 08/22/2015 Document Reviewed: 05/28/2015 Elsevier Interactive Patient Education  2018 Elsevier Inc[employer]   

## 2017-08-05 NOTE — Assessment & Plan Note (Signed)
Triamc prn cream

## 2017-08-05 NOTE — Progress Notes (Signed)
Subjective:  [name], male    DOB: 11/27/1955  Age: 62 y.o. MRN: [id number]  CC: No chief complaint on file.   HPI Kenneth SeverinRoy Ruddy comes for a well exam  Outpatient Medications Prior to Visit  Medication Sig Dispense Refill  . atorvastatin (LIPITOR) 10 MG tablet Take 1 tablet (10 mg total) by mouth daily. 90 tablet 3  . bacitracin ointment Apply 1 application topically 2 (two) times daily. 15 g 0  . Cholecalciferol (VITAMIN D3) 1000 UNITS CAPS Take 1 capsule (1,000 Units total) by mouth daily. 100 capsule 3  . Diclofenac Sodium (PENNSAID) 2 % SOLN Place 2 application onto the skin 2 (two) times daily. 112 g 3  . fluticasone furoate-vilanterol (BREO ELLIPTA) 100-25 MCG/INH AEPB Inhale 1 puff into the lungs daily. 1 each 5  . gabapentin (NEURONTIN) 100 MG capsule Take 2 capsules (200 mg total) by mouth at bedtime. 60 capsule 3  . loratadine (CLARITIN) 10 MG tablet Take 1 tablet (10 mg total) by mouth daily. 90 tablet 5  . LORazepam (ATIVAN) 0.5 MG tablet TAKE 1 TO 2 TABLETS BY MOUTH TWICE A DAY 180 tablet 1  . meloxicam (MOBIC) 15 MG tablet TAKE 1 TABLET BY MOUTH ONCE DAILY WITH FOOD FOR 2 WEEKS THEN AS NEEDED 30 tablet 1  . pantoprazole (PROTONIX) 40 MG tablet Take 1 tablet (40 mg total) by mouth daily. 90 tablet 3  . scopolamine (TRANSDERM-SCOP, 1.5 MG,) 1 MG/3DAYS Place 1 patch (1.5 mg total) onto the skin every 3 (three) days. 10 patch 0  . traMADol (ULTRAM) 50 MG tablet TAKE 1 TO 2 TABLETS BY MOUTH TWICE A DAY AS NEEDED 60 tablet 3  . Vitamin D, Ergocalciferol, (DRISDOL) 50000 units CAPS capsule TAKE 1 CAPSULE (50,000 UNITS TOTAL) BY MOUTH EVERY 7 (SEVEN) DAYS. 12 capsule 0  . albuterol (PROVENTIL HFA;VENTOLIN HFA) 108 (90 Base) MCG/ACT inhaler Inhale 2 puffs into the lungs every 6 (six) hours as needed. 1 Inhaler 3   No facility-administered medications prior to visit.     ROS: Review of Systems  Constitutional: Positive for fatigue. Negative for appetite change and  unexpected weight change.  HENT: Negative for congestion, nosebleeds, sneezing, sore throat and trouble swallowing.   Eyes: Negative for itching and visual disturbance.  Respiratory: Positive for cough, shortness of breath and wheezing.   Cardiovascular: Negative for chest pain, palpitations and leg swelling.  Gastrointestinal: Negative for abdominal distention, blood in stool, diarrhea and nausea.  Genitourinary: Negative for frequency and hematuria.  Musculoskeletal: Positive for arthralgias and back pain. Negative for gait problem, joint swelling and neck pain.  Skin: Negative for rash.  Neurological: Negative for dizziness, tremors, speech difficulty and weakness.  Psychiatric/Behavioral: Positive for dysphoric mood and sleep disturbance. Negative for agitation and suicidal ideas. The patient is nervous/anxious.     Objective:  Ht 5\' 11"  (1.803 m)   Wt 224 lb (101.6 kg)   BMI 31.24 kg/m   BP Readings from Last 3 Encounters:  08/23/16 130/82  07/30/16 124/76  03/22/16 130/78    Wt Readings from Last 3 Encounters:  08/05/17 224 lb (101.6 kg)  08/23/16 220 lb (99.8 kg)  07/30/16 221 lb (100.2 kg)    Physical Exam  Constitutional: He is oriented to person, place, and time. He appears well-developed. No distress.  NAD  HENT:  Mouth/Throat: Oropharynx is clear and moist.  Eyes: Pupils are equal, round, and reactive to light. Conjunctivae are normal.  Neck: Normal range of motion.  No JVD present. No thyromegaly present.  Cardiovascular: Normal rate, regular rhythm, normal heart sounds and intact distal pulses. Exam reveals no gallop and no friction rub.  No murmur heard. Pulmonary/Chest: Effort normal and breath sounds normal. No respiratory distress. He has no wheezes. He has no rales. He exhibits no tenderness.  Abdominal: Soft. Bowel sounds are normal. He exhibits no distension and no mass. There is no tenderness. There is no rebound and no guarding.  Musculoskeletal: Normal  range of motion. He exhibits no edema or tenderness.  Lymphadenopathy:    He has no cervical adenopathy.  Neurological: He is alert and oriented to person, place, and time. He has normal reflexes. No cranial nerve deficit. He exhibits normal muscle tone. He displays a negative Romberg sign. Coordination and gait normal.  Skin: Skin is warm and dry. No rash noted.  Psychiatric: He has a normal mood and affect. His behavior is normal. Judgment and thought content normal.    Lab Results  Component Value Date   WBC 4.2 07/30/2016   HGB 14.5 07/30/2016   HCT 44.3 07/30/2016   PLT 181.0 07/30/2016   GLUCOSE 97 07/30/2016   CHOL 119 07/30/2016   TRIG 78.0 07/30/2016   HDL 37.70 (L) 07/30/2016   LDLCALC 65 07/30/2016   ALT 13 07/30/2016   AST 14 07/30/2016   NA 143 07/30/2016   K 4.6 07/30/2016   CL 107 07/30/2016   CREATININE 1.26 07/30/2016   BUN 10 07/30/2016   CO2 30 07/30/2016   TSH 0.92 07/30/2016   PSA 0.22 07/30/2016   HGBA1C 6.1 04/09/2013    Dg Cervical Spine Complete  Result Date: 08/24/2016 CLINICAL DATA:  Chronic left shoulder pain EXAM: CERVICAL SPINE - COMPLETE 4+ VIEW COMPARISON:  03/01/2011 FINDINGS: Degenerative disc disease from C3-4 through C6-7, most pronounced at C4-5 thru C6-7 with disc space narrowing and spurring. Bilateral neural foraminal narrowing at C5-6 and C6-7. No fracture or malalignment. Prevertebral soft tissues are normal. IMPRESSION: Degenerative disc and facet disease, no acute bony abnormality. Electronically Signed   By: Charlett Nose M.D.   On: 08/24/2016 08:04   [place] Limited Joint Space Structures Up Left  Result Date: 08/29/2016 MSK US performed of: left This study was ordered, performed, and interpreted by Terrilee Files D.O.  Shoulder:  Supraspinatus:  Appears normal on long and transverse views, Bursal bulge seen with shoulder abduction on impingement view. Infraspinatus:  Appears normal on long and transverse views. Significant increase in  Doppler flow Subscapularis:  Appears normal on long and transverse views. Positive bursa Teres Minor:  Appears normal on long and transverse views. AC joint:  Capsule undistended, no geyser sign. Glenohumeral Joint:  Appears normal without effusion. Glenoid Labrum:  Intact without visualized tears. Biceps Tendon:  Appears normal on long and transverse views, no fraying of tendon, tendon located in intertubercular groove, no subluxation with shoulder internal or external rotation.  Impression: Subacromial bursitis rotator cuff intact    Assessment & Plan:   There are no diagnoses linked to this encounter.   No orders of the defined types were placed in this encounter.    Follow-up: No follow-ups on file.  Sonda Primes, MD

## 2017-08-05 NOTE — Assessment & Plan Note (Signed)
Breo, Proair qd

## 2017-08-05 NOTE — Assessment & Plan Note (Signed)
Flonase

## 2017-08-15 ENCOUNTER — Other Ambulatory Visit: Payer: Self-pay | Admitting: Family Medicine

## 2017-08-15 NOTE — Telephone Encounter (Signed)
Refill denied. Pt needs an appt. 

## 2017-08-24 ENCOUNTER — Other Ambulatory Visit: Payer: Self-pay

## 2017-08-24 MED ORDER — VITAMIN D (ERGOCALCIFEROL) 1.25 MG (50000 UNIT) PO CAPS: 50000.0000 [IU] | ORAL_CAPSULE | ORAL | 0 refills | Status: DC

## 2017-09-09 ENCOUNTER — Encounter: Payer: Self-pay | Admitting: Internal Medicine

## 2017-11-27 ENCOUNTER — Other Ambulatory Visit: Payer: Self-pay | Admitting: Family Medicine

## 2017-11-28 NOTE — Telephone Encounter (Signed)
Refill done.  

## 2018-03-01 ENCOUNTER — Other Ambulatory Visit: Payer: Self-pay | Admitting: Internal Medicine

## 2018-03-19 ENCOUNTER — Other Ambulatory Visit: Payer: Self-pay | Admitting: Internal Medicine

## 2018-08-12 ENCOUNTER — Other Ambulatory Visit: Payer: Self-pay | Admitting: Internal Medicine

## 2018-08-30 ENCOUNTER — Other Ambulatory Visit: Payer: Self-pay | Admitting: Internal Medicine

## 2018-10-20 ENCOUNTER — Other Ambulatory Visit: Payer: Self-pay | Admitting: Internal Medicine

## 2018-11-18 ENCOUNTER — Ambulatory Visit (INDEPENDENT_AMBULATORY_CARE_PROVIDER_SITE_OTHER): Payer: BC Managed Care – PPO

## 2018-11-18 DIAGNOSIS — Z23 Encounter for immunization: Secondary | ICD-10-CM | POA: Diagnosis not present

## 2018-11-23 ENCOUNTER — Ambulatory Visit (INDEPENDENT_AMBULATORY_CARE_PROVIDER_SITE_OTHER): Payer: BC Managed Care – PPO | Admitting: Internal Medicine

## 2018-11-23 ENCOUNTER — Other Ambulatory Visit (INDEPENDENT_AMBULATORY_CARE_PROVIDER_SITE_OTHER): Payer: BC Managed Care – PPO

## 2018-11-23 ENCOUNTER — Encounter: Payer: Self-pay | Admitting: Internal Medicine

## 2018-11-23 ENCOUNTER — Other Ambulatory Visit: Payer: Self-pay

## 2018-11-23 VITALS — BP 132/86 | HR 57 | Temp 98.1°F | Ht 71.0 in | Wt 222.0 lb

## 2018-11-23 DIAGNOSIS — Z125 Encounter for screening for malignant neoplasm of prostate: Secondary | ICD-10-CM

## 2018-11-23 DIAGNOSIS — Z0000 Encounter for general adult medical examination without abnormal findings: Secondary | ICD-10-CM

## 2018-11-23 DIAGNOSIS — R739 Hyperglycemia, unspecified: Secondary | ICD-10-CM

## 2018-11-23 DIAGNOSIS — E785 Hyperlipidemia, unspecified: Secondary | ICD-10-CM

## 2018-11-23 DIAGNOSIS — H9313 Tinnitus, bilateral: Secondary | ICD-10-CM | POA: Diagnosis not present

## 2018-11-23 LAB — URINALYSIS
Bilirubin Urine: NEGATIVE
Ketones, ur: NEGATIVE
Leukocytes,Ua: NEGATIVE
Nitrite: NEGATIVE
Specific Gravity, Urine: 1.01 (ref 1.000–1.030)
Total Protein, Urine: NEGATIVE
Urine Glucose: NEGATIVE
Urobilinogen, UA: 0.2 (ref 0.0–1.0)
pH: 6 (ref 5.0–8.0)

## 2018-11-23 LAB — LIPID PANEL
Cholesterol: 117 mg/dL (ref 0–200)
HDL: 37.3 mg/dL — ABNORMAL LOW (ref >39.00)
LDL Cholesterol: 61 mg/dL (ref 0–99)
NonHDL: 79.72
Total CHOL/HDL Ratio: 3
Triglycerides: 95 mg/dL (ref 0.0–149.0)
VLDL: 19 mg/dL (ref 0.0–40.0)

## 2018-11-23 LAB — BASIC METABOLIC PANEL
BUN: 7 mg/dL (ref 6–23)
CO2: 27 mEq/L (ref 19–32)
Calcium: 9.3 mg/dL (ref 8.4–10.5)
Chloride: 106 mEq/L (ref 96–112)
Creatinine, Ser: 1.17 mg/dL (ref 0.40–1.50)
GFR: 62.42 mL/min (ref >60.00)
Glucose, Bld: 83 mg/dL (ref 70–99)
Potassium: 4.1 mEq/L (ref 3.5–5.1)
Sodium: 141 mEq/L (ref 135–145)

## 2018-11-23 LAB — HEPATIC FUNCTION PANEL
ALT: 17 U/L (ref 0–53)
AST: 15 U/L (ref 0–37)
Albumin: 4.6 g/dL (ref 3.5–5.2)
Alkaline Phosphatase: 48 U/L (ref 39–117)
Bilirubin, Direct: 0.1 mg/dL (ref 0.0–0.3)
Total Bilirubin: 0.6 mg/dL (ref 0.2–1.2)
Total Protein: 7.1 g/dL (ref 6.0–8.3)

## 2018-11-23 LAB — PSA: PSA: 0.24 ng/mL (ref 0.10–4.00)

## 2018-11-23 LAB — TSH: TSH: 1.2 u[IU]/mL (ref 0.35–4.50)

## 2018-11-23 NOTE — Progress Notes (Signed)
Subjective:  [name], male    DOB: 1955/05/23  Age: 63 y.o. MRN: [id number]  CC: No chief complaint on file.   HPI Kenneth [name] presents for a well exam  Outpatient Medications Prior to Visit  Medication Sig Dispense Refill  . albuterol (VENTOLIN HFA) 108 (90 Base) MCG/ACT inhaler TAKE 2 PUFFS BY MOUTH EVERY 6 HOURS AS NEEDED FOR WHEEZE 18 g 3  . atorvastatin (LIPITOR) 10 MG tablet TAKE 1 TABLET BY MOUTH EVERY DAY 90 tablet 1  . Cholecalciferol (VITAMIN D3) 1000 UNITS CAPS Take 1 capsule (1,000 Units total) by mouth daily. 100 capsule 3  . Diclofenac Sodium (PENNSAID) 2 % SOLN Place 2 application onto the skin 2 (two) times daily. 112 g 3  . loratadine (CLARITIN) 10 MG tablet Take 1 tablet (10 mg total) by mouth daily. 90 tablet 5  . LORazepam (ATIVAN) 0.5 MG tablet TAKE 1 TO 2 TABLETS BY MOUTH TWICE A DAY AS NEEDED 180 tablet 1  . meloxicam (MOBIC) 15 MG tablet TAKE 1 TABLET BY MOUTH ONCE DAILY WITH FOOD FOR 2 WEEKS THEN AS NEEDED 30 tablet 1  . pantoprazole (PROTONIX) 40 MG tablet Take 1 tablet (40 mg total) by mouth daily. 90 tablet 3  . scopolamine (TRANSDERM-SCOP, 1.5 MG,) 1 MG/3DAYS Place 1 patch (1.5 mg total) onto the skin every 3 (three) days. 10 patch 0  . traMADol (ULTRAM) 50 MG tablet TAKE 1-2 TABLETS TWICE A DAY AS NEEDED FOR PAIN 100 tablet 1  . Vitamin D, Ergocalciferol, (DRISDOL) 50000 units CAPS capsule Take 1 capsule (50,000 Units total) by mouth every 7 (seven) days. 12 capsule 0   No facility-administered medications prior to visit.     ROS: Review of Systems  Constitutional: Negative for appetite change, fatigue and unexpected weight change.  HENT: Negative for congestion, nosebleeds, sneezing, sore throat and trouble swallowing.   Eyes: Negative for itching and visual disturbance.  Respiratory: Negative for cough.   Cardiovascular: Negative for chest pain, palpitations and leg swelling.  Gastrointestinal: Negative for abdominal distention, blood in  stool, diarrhea and nausea.  Genitourinary: Negative for frequency and hematuria.  Musculoskeletal: Negative for back pain, gait problem, joint swelling and neck pain.  Skin: Negative for rash.  Neurological: Negative for dizziness, tremors, speech difficulty and weakness.  Psychiatric/Behavioral: Negative for agitation, dysphoric mood, sleep disturbance and suicidal ideas. The patient is not nervous/anxious.     Objective:  BP 132/86 (BP Location: Left Arm, Patient Position: Sitting, Cuff Size: Large)   Pulse (!) 57   Temp 98.1 F (36.7 C) (Oral)   Ht 5\' 11"  (1.803 m)   Wt 222 lb (100.7 kg)   SpO2 96%   BMI 30.96 kg/m   BP Readings from Last 3 Encounters:  11/23/18 132/86  08/05/17 130/84  08/23/16 130/82    Wt Readings from Last 3 Encounters:  11/23/18 222 lb (100.7 kg)  08/05/17 224 lb (101.6 kg)  08/23/16 220 lb (99.8 kg)    Physical Exam Constitutional:      General: He is not in acute distress.    Appearance: He is well-developed.     Comments: NAD  Eyes:     Conjunctiva/sclera: Conjunctivae normal.     Pupils: Pupils are equal, round, and reactive to light.  Neck:     Musculoskeletal: Normal range of motion.     Thyroid: No thyromegaly.     Vascular: No JVD.  Cardiovascular:     Rate and Rhythm: Normal rate  and regular rhythm.     Heart sounds: Normal heart sounds. No murmur. No friction rub. No gallop.   Pulmonary:     Effort: Pulmonary effort is normal. No respiratory distress.     Breath sounds: Normal breath sounds. No wheezing or rales.  Chest:     Chest wall: No tenderness.  Abdominal:     General: Bowel sounds are normal. There is no distension.     Palpations: Abdomen is soft. There is no mass.     Tenderness: There is no abdominal tenderness. There is no guarding or rebound.  Musculoskeletal: Normal range of motion.        General: No tenderness.  Lymphadenopathy:     Cervical: No cervical adenopathy.  Skin:    General: Skin is warm and dry.      Findings: No rash.  Neurological:     Mental Status: He is alert and oriented to person, place, and time.     Cranial Nerves: No cranial nerve deficit.     Motor: No abnormal muscle tone.     Coordination: Coordination normal.     Gait: Gait normal.     Deep Tendon Reflexes: Reflexes are normal and symmetric.  Psychiatric:        Behavior: Behavior normal.        Thought Content: Thought content normal.        Judgment: Judgment normal.     Lab Results  Component Value Date   WBC 5.6 08/05/2017   HGB 14.6 08/05/2017   HCT 43.4 08/05/2017   PLT 178.0 08/05/2017   GLUCOSE 90 08/05/2017   CHOL 125 08/05/2017   TRIG 81.0 08/05/2017   HDL 41.30 08/05/2017   LDLCALC 68 08/05/2017   ALT 18 08/05/2017   AST 15 08/05/2017   NA 141 08/05/2017   K 4.4 08/05/2017   CL 106 08/05/2017   CREATININE 1.19 08/05/2017   BUN 8 08/05/2017   CO2 27 08/05/2017   TSH 0.70 08/05/2017   PSA 0.16 08/05/2017   HGBA1C 6.1 04/09/2013    Dg Chest 2 View  Result Date: 08/05/2017 CLINICAL DATA:  Upper chest pain shortness of breath EXAM: CHEST - 2 VIEW COMPARISON:  05/16/2015 FINDINGS: The heart size and mediastinal contours are within normal limits. Both lungs are hyperinflated but clear. The visualized skeletal structures are unremarkable. IMPRESSION: COPD without acute abnormality. Electronically Signed   By: Alcide Clever M.D.   On: 08/05/2017 16:12    Assessment & Plan:   There are no diagnoses linked to this encounter.   No orders of the defined types were placed in this encounter.    Follow-up: No follow-ups on file.  Sonda Primes, MD

## 2018-11-23 NOTE — Assessment & Plan Note (Signed)
Options discussed 

## 2018-11-23 NOTE — Assessment & Plan Note (Signed)
A1c

## 2018-11-23 NOTE — Assessment & Plan Note (Signed)
2020 Lipitor Coronary calcium CT offered 10/20

## 2018-11-23 NOTE — Assessment & Plan Note (Addendum)
We discussed age appropriate health related issues, including available/recomended screening tests and vaccinations. We discussed a need for adhering to healthy diet and exercise. Labs were ordered to be later reviewed . All questions were answered. Prevnar in 1 mo Cologuard Coronary calcium CT offered 10/20

## 2018-11-23 NOTE — Patient Instructions (Signed)
Cardiac CT calcium scoring test $150   Computed tomography, more commonly known as a CT or CAT scan, is a diagnostic medical imaging test. Like traditional x-rays, it produces multiple images or pictures of the inside of the body. The cross-sectional images generated during a CT scan can be reformatted in multiple planes. They can even generate three-dimensional images. These images can be viewed on a computer monitor, printed on film or by a 3D printer, or transferred to a CD or DVD. CT images of internal organs, bones, soft tissue and blood vessels provide greater detail than traditional x-rays, particularly of soft tissues and blood vessels. A cardiac CT scan for coronary calcium is a non-invasive way of obtaining information about the presence, location and extent of calcified plaque in the coronary arteries-the vessels that supply oxygen-containing blood to the heart muscle. Calcified plaque results when there is a build-up of fat and other substances under the inner layer of the artery. This material can calcify which signals the presence of atherosclerosis, a disease of the vessel wall, also called coronary artery disease (CAD). People with this disease have an increased risk for heart attacks. In addition, over time, progression of plaque build up (CAD) can narrow the arteries or even close off blood flow to the heart. The result may be chest pain, sometimes called "angina," or a heart attack. Because calcium is a marker of CAD, the amount of calcium detected on a cardiac CT scan is a helpful prognostic tool. The findings on cardiac CT are expressed as a calcium score. Another name for this test is coronary artery calcium scoring.  What are some common uses of the procedure? The goal of cardiac CT scan for calcium scoring is to determine if CAD is present and to what extent, even if there are no symptoms. It is a screening study that may be recommended by a physician for patients with risk factors  for CAD but no clinical symptoms. The major risk factors for CAD are: . high blood cholesterol levels  . family history of heart attacks  . diabetes  . high blood pressure  . cigarette smoking  . overweight or obese  . physical inactivity   A negative cardiac CT scan for calcium scoring shows no calcification within the coronary arteries. This suggests that CAD is absent or so minimal it cannot be seen by this technique. The chance of having a heart attack over the next two to five years is very low under these circumstances. A positive test means that CAD is present, regardless of whether or not the patient is experiencing any symptoms. The amount of calcification-expressed as the calcium score-may help to predict the likelihood of a myocardial infarction (heart attack) in the coming years and helps your medical doctor or cardiologist decide whether the patient may need to take preventive medicine or undertake other measures such as diet and exercise to lower the risk for heart attack. The extent of CAD is graded according to your calcium score:  Calcium Score  Presence of CAD (coronary artery disease)  0 No evidence of CAD   1-10 Minimal evidence of CAD  11-100 Mild evidence of CAD  101-400 Moderate evidence of CAD  Over 400 Extensive evidence of CAD     These suggestions will probably help you to improve your metabolism if you are not overweight and to lose weight if you are overweight: 1.  Reduce your consumption of sugars and starches.  Eliminate high fructose corn syrup from your   diet.  Reduce your consumption of processed foods.  For desserts try to have seasonal fruits, berries, nuts, cheeses or dark chocolate with more than 70% cacao. 2.  Do not snack 3.  You do not have to eat breakfast.  If you choose to have breakfast-eat plain greek yogurt, eggs, oatmeal (without sugar) 4.  Drink water, freshly brewed unsweetened tea (green, black or herbal) or coffee.  Do not drink sodas  including diet sodas , juices, beverages sweetened with artificial sweeteners. 5.  Reduce your consumption of refined grains. 6.  Avoid protein drinks such as Optifast, Slim fast etc. Eat chicken, fish, meat, dairy and beans for your sources of protein 7.  Natural unprocessed fats like cold pressed virgin olive oil, butter, coconut oil are good for you.  Eat avocados 8.  Increase your consumption of fiber.  Fruits, berries, vegetables, whole grains, flaxseeds, Chia seeds, beans, popcorn, nuts, oatmeal are good sources of fiber 9.  Use vinegar in your diet, i.e. apple cider vinegar, red wine or balsamic vinegar 10.  You can try fasting.  For example you can skip breakfast and lunch every other day (24-hour fast) 11.  Stress reduction, good night sleep, relaxation, meditation, yoga and other physical activity is likely to help you to maintain low weight too. 12.  If you drink alcohol, limit your alcohol intake to no more than 2 drinks a day.   Mediterranean diet is good for you. ([employer] has a typical Mediterranean cuisine menu) The Mediterranean diet is a way of eating based on the traditional cuisine of countries bordering the [place] Sea. While there is no single definition of the Mediterranean diet, it is typically high in vegetables, fruits, whole grains, beans, nut and seeds, and olive oil. The main components of Mediterranean diet include: . Daily consumption of vegetables, fruits, whole grains and healthy fats  . Weekly intake of fish, poultry, beans and eggs  . Moderate portions of dairy products  . Limited intake of red meat Other important elements of the Mediterranean diet are sharing meals with family and friends, enjoying a glass of red wine and being physically active. Health benefits of a Mediterranean diet: A traditional Mediterranean diet consisting of large quantities of fresh fruits and vegetables, nuts, fish and olive oil-coupled with physical activity-can reduce  your risk of serious mental and physical health problems by: Preventing heart disease and strokes. Following a Mediterranean diet limits your intake of refined breads, processed foods, and red meat, and encourages drinking red wine instead of hard liquor-all factors that can help prevent heart disease and stroke. Keeping you agile. If you're an older adult, the nutrients gained with a Mediterranean diet may reduce your risk of developing muscle weakness and other signs of frailty by about 70 percent. Reducing the risk of Alzheimer's. Research suggests that the Mediterranean diet may improve cholesterol, blood sugar levels, and overall blood vessel health, which in turn may reduce your risk of Alzheimer's disease or dementia. Halving the risk of Parkinson's disease. The high levels of antioxidants in the Mediterranean diet can prevent cells from undergoing a damaging process called oxidative stress, thereby cutting the risk of Parkinson's disease in half. Increasing longevity. By reducing your risk of developing heart disease or cancer with the Mediterranean diet, you're reducing your risk of death at any age by 20%. Protecting against type 2 diabetes. A Mediterranean diet is rich in fiber which digests slowly, prevents huge swings in blood sugar, and can help you maintain a healthy   weight.    Cabbage soup recipe that will not make you gain weight: Take 1 small head of cabbage, 1 average pack of celery, 4 green peppers, 4 onions, 2 cans diced tomatoes (they are not available without salt), salt and spices to taste.  Chop cabbage, celery, peppers and onions.  And tomatoes and 2-2.5 liters (2.5 quarts) of water so that it would just cover the vegetables.  Bring to boil.  Add spices and salt.  Turn heat to low/medium and simmer for 20-25 minutes.  Naturally, you can make a smaller batch and change some of the ingredients.  

## 2018-11-24 LAB — CBC WITH DIFFERENTIAL/PLATELET
Basophils Absolute: 0 10*3/uL (ref 0.0–0.1)
Basophils Relative: 0.7 % (ref 0.0–3.0)
Eosinophils Absolute: 0.2 10*3/uL (ref 0.0–0.7)
Eosinophils Relative: 3.3 % (ref 0.0–5.0)
HCT: 43.4 % (ref 39.0–52.0)
Hemoglobin: 14.4 g/dL (ref 13.0–17.0)
Lymphocytes Relative: 29.3 % (ref 12.0–46.0)
Lymphs Abs: 1.3 10*3/uL (ref 0.7–4.0)
MCHC: 33.2 g/dL (ref 30.0–36.0)
MCV: 93.3 fl (ref 78.0–100.0)
Monocytes Absolute: 0.4 10*3/uL (ref 0.1–1.0)
Monocytes Relative: 9 % (ref 3.0–12.0)
Neutro Abs: 2.6 10*3/uL (ref 1.4–7.7)
Neutrophils Relative %: 57.7 % (ref 43.0–77.0)
Platelets: 160 10*3/uL (ref 150.0–400.0)
RBC: 4.66 Mil/uL (ref 4.22–5.81)
RDW: 13.2 % (ref 11.5–15.5)
WBC: 4.6 10*3/uL (ref 4.0–10.5)

## 2018-12-15 ENCOUNTER — Encounter: Payer: Self-pay | Admitting: Internal Medicine

## 2019-01-16 ENCOUNTER — Other Ambulatory Visit: Payer: Self-pay | Admitting: Internal Medicine

## 2019-02-28 ENCOUNTER — Other Ambulatory Visit: Payer: Self-pay | Admitting: Internal Medicine

## 2019-03-20 ENCOUNTER — Other Ambulatory Visit: Payer: Self-pay | Admitting: Internal Medicine

## 2019-05-29 ENCOUNTER — Other Ambulatory Visit: Payer: Self-pay | Admitting: Internal Medicine

## 2019-10-22 ENCOUNTER — Other Ambulatory Visit: Payer: Self-pay | Admitting: Internal Medicine

## 2019-11-27 ENCOUNTER — Other Ambulatory Visit: Payer: Self-pay | Admitting: Internal Medicine

## 2019-12-07 ENCOUNTER — Encounter: Payer: BC Managed Care – PPO | Admitting: Internal Medicine

## 2019-12-25 ENCOUNTER — Encounter: Payer: Self-pay | Admitting: Internal Medicine

## 2019-12-25 ENCOUNTER — Other Ambulatory Visit: Payer: Self-pay

## 2019-12-25 ENCOUNTER — Ambulatory Visit (INDEPENDENT_AMBULATORY_CARE_PROVIDER_SITE_OTHER): Payer: BC Managed Care – PPO | Admitting: Internal Medicine

## 2019-12-25 VITALS — BP 128/72 | HR 69 | Temp 98.8°F | Wt 236.2 lb

## 2019-12-25 DIAGNOSIS — Z23 Encounter for immunization: Secondary | ICD-10-CM

## 2019-12-25 DIAGNOSIS — Z0000 Encounter for general adult medical examination without abnormal findings: Secondary | ICD-10-CM

## 2019-12-25 DIAGNOSIS — E785 Hyperlipidemia, unspecified: Secondary | ICD-10-CM

## 2019-12-25 DIAGNOSIS — R944 Abnormal results of kidney function studies: Secondary | ICD-10-CM | POA: Diagnosis not present

## 2019-12-25 DIAGNOSIS — Z1211 Encounter for screening for malignant neoplasm of colon: Secondary | ICD-10-CM

## 2019-12-25 LAB — COMPREHENSIVE METABOLIC PANEL
ALT: 17 U/L (ref 0–53)
AST: 18 U/L (ref 0–37)
Albumin: 4.5 g/dL (ref 3.5–5.2)
Alkaline Phosphatase: 51 U/L (ref 39–117)
BUN: 15 mg/dL (ref 6–23)
CO2: 31 mEq/L (ref 19–32)
Calcium: 9.3 mg/dL (ref 8.4–10.5)
Chloride: 104 mEq/L (ref 96–112)
Creatinine, Ser: 1.31 mg/dL (ref 0.40–1.50)
GFR: 56.59 mL/min — ABNORMAL LOW (ref >60.00)
Glucose, Bld: 82 mg/dL (ref 70–99)
Potassium: 4.2 mEq/L (ref 3.5–5.1)
Sodium: 143 mEq/L (ref 135–145)
Total Bilirubin: 0.5 mg/dL (ref 0.2–1.2)
Total Protein: 6.9 g/dL (ref 6.0–8.3)

## 2019-12-25 LAB — URINALYSIS
Bilirubin Urine: NEGATIVE
Hgb urine dipstick: NEGATIVE
Ketones, ur: NEGATIVE
Leukocytes,Ua: NEGATIVE
Nitrite: NEGATIVE
Specific Gravity, Urine: 1.02 (ref 1.000–1.030)
Total Protein, Urine: NEGATIVE
Urine Glucose: NEGATIVE
Urobilinogen, UA: 0.2 (ref 0.0–1.0)
pH: 7.5 (ref 5.0–8.0)

## 2019-12-25 LAB — PSA: PSA: 0.33 ng/mL (ref 0.10–4.00)

## 2019-12-25 LAB — LDL CHOLESTEROL, DIRECT: Direct LDL: 68 mg/dL

## 2019-12-25 LAB — CBC WITH DIFFERENTIAL/PLATELET
Basophils Absolute: 0 10*3/uL (ref 0.0–0.1)
Basophils Relative: 0.6 % (ref 0.0–3.0)
Eosinophils Absolute: 0.1 10*3/uL (ref 0.0–0.7)
Eosinophils Relative: 2.7 % (ref 0.0–5.0)
HCT: 42.5 % (ref 39.0–52.0)
Hemoglobin: 14 g/dL (ref 13.0–17.0)
Lymphocytes Relative: 30.9 % (ref 12.0–46.0)
Lymphs Abs: 1.7 10*3/uL (ref 0.7–4.0)
MCHC: 32.9 g/dL (ref 30.0–36.0)
MCV: 91.8 fl (ref 78.0–100.0)
Monocytes Absolute: 0.5 10*3/uL (ref 0.1–1.0)
Monocytes Relative: 8.6 % (ref 3.0–12.0)
Neutro Abs: 3.1 10*3/uL (ref 1.4–7.7)
Neutrophils Relative %: 57.2 % (ref 43.0–77.0)
Platelets: 182 10*3/uL (ref 150.0–400.0)
RBC: 4.63 Mil/uL (ref 4.22–5.81)
RDW: 13.2 % (ref 11.5–15.5)
WBC: 5.5 10*3/uL (ref 4.0–10.5)

## 2019-12-25 LAB — LIPID PANEL
Cholesterol: 120 mg/dL (ref 0–200)
HDL: 35.8 mg/dL — ABNORMAL LOW (ref >39.00)
NonHDL: 83.82
Total CHOL/HDL Ratio: 3
Triglycerides: 202 mg/dL — ABNORMAL HIGH (ref 0.0–149.0)
VLDL: 40.4 mg/dL — ABNORMAL HIGH (ref 0.0–40.0)

## 2019-12-25 LAB — TSH: TSH: 0.92 u[IU]/mL (ref 0.35–4.50)

## 2019-12-25 MED ORDER — ATORVASTATIN CALCIUM 10 MG PO TABS
10.0000 mg | ORAL_TABLET | Freq: Every day | ORAL | 3 refills | Status: DC
Start: 2019-12-25 — End: 2021-01-01

## 2019-12-25 MED ORDER — ALBUTEROL SULFATE HFA 108 (90 BASE) MCG/ACT IN AERS
1.0000 | INHALATION_SPRAY | RESPIRATORY_TRACT | 3 refills | Status: DC | PRN
Start: 2019-12-25 — End: 2021-01-01

## 2019-12-25 MED ORDER — PANTOPRAZOLE SODIUM 40 MG PO TBEC
40.0000 mg | DELAYED_RELEASE_TABLET | Freq: Every day | ORAL | 3 refills | Status: DC
Start: 2019-12-25 — End: 2021-01-01

## 2019-12-25 MED ORDER — LORAZEPAM 0.5 MG PO TABS
ORAL_TABLET | ORAL | 1 refills | Status: DC
Start: 2019-12-25 — End: 2020-08-21

## 2019-12-25 MED ORDER — SCOPOLAMINE 1 MG/3DAYS TD PT72: 1.0000 | MEDICATED_PATCH | TRANSDERMAL | 0 refills | Status: DC

## 2019-12-25 MED ORDER — MELOXICAM 15 MG PO TABS: ORAL_TABLET | ORAL | 1 refills | Status: DC

## 2019-12-25 MED ORDER — TRAMADOL HCL 50 MG PO TABS: ORAL_TABLET | ORAL | 3 refills | Status: DC

## 2019-12-25 NOTE — Assessment & Plan Note (Addendum)
We discussed age appropriate health related issues, including available/recomended screening tests and vaccinations. We discussed a need for adhering to healthy diet and exercise. Labs were ordered to be later reviewed . All questions were answered. Cologuard Coronary calcium CT offered - scheduled

## 2019-12-25 NOTE — Progress Notes (Signed)
Subjective:  [name], male    DOB: 01/12/1956  Age: 64 y.o. MRN: [id number]  CC: Annual Exam (fLU SHOT)   HPI Kenneth [name] presents for a well exam   Outpatient Medications Prior to Visit  Medication Sig Dispense Refill  . albuterol (VENTOLIN HFA) 108 (90 Base) MCG/ACT inhaler INHALE 2 PUFFS BY MOUTH EVERY 6 HOURS AS NEEDED FOR WHEEZING 18 g 3  . atorvastatin (LIPITOR) 10 MG tablet Take 1 tablet (10 mg total) by mouth daily. Annual appt is due must see provider for future refills 30 tablet 0  . Cholecalciferol (VITAMIN D3) 1000 UNITS CAPS Take 1 capsule (1,000 Units total) by mouth daily. 100 capsule 3  . Diclofenac Sodium (PENNSAID) 2 % SOLN Place 2 application onto the skin 2 (two) times daily. 112 g 3  . loratadine (CLARITIN) 10 MG tablet Take 1 tablet (10 mg total) by mouth daily. 90 tablet 5  . LORazepam (ATIVAN) 0.5 MG tablet TAKE 1 TO 2 TABLETS BY MOUTH TWICE A DAY AS NEEDED 180 tablet 1  . meloxicam (MOBIC) 15 MG tablet TAKE 1 TABLET BY MOUTH ONCE DAILY WITH FOOD FOR 2 WEEKS THEN AS NEEDED 30 tablet 1  . pantoprazole (PROTONIX) 40 MG tablet TAKE 1 TABLET BY MOUTH EVERY DAY 90 tablet 2  . scopolamine (TRANSDERM-SCOP, 1.5 MG,) 1 MG/3DAYS Place 1 patch (1.5 mg total) onto the skin every 3 (three) days. 10 patch 0  . traMADol (ULTRAM) 50 MG tablet TAKE 1-2 TABLETS TWICE A DAY AS NEEDED FOR PAIN 100 tablet 3  . Vitamin D, Ergocalciferol, (DRISDOL) 50000 units CAPS capsule Take 1 capsule (50,000 Units total) by mouth every 7 (seven) days. (Patient not taking: Reported on 12/25/2019) 12 capsule 0   No facility-administered medications prior to visit.    ROS: Review of Systems  Constitutional: Negative for appetite change, fatigue and unexpected weight change.  HENT: Negative for congestion, nosebleeds, sneezing, sore throat and trouble swallowing.   Eyes: Negative for itching and visual disturbance.  Respiratory: Negative for cough.   Cardiovascular: Negative for chest  pain, palpitations and leg swelling.  Gastrointestinal: Negative for abdominal distention, blood in stool, diarrhea and nausea.  Genitourinary: Negative for frequency and hematuria.  Musculoskeletal: Negative for back pain, gait problem, joint swelling and neck pain.  Skin: Negative for rash.  Neurological: Negative for dizziness, tremors, speech difficulty and weakness.  Psychiatric/Behavioral: Negative for agitation, dysphoric mood and sleep disturbance. The patient is not nervous/anxious.     Objective:  BP 128/72 (BP Location: Left Arm)   Pulse 69   Temp 98.8 F (37.1 C) (Oral)   Wt 236 lb 3.2 oz (107.1 kg)   SpO2 96%   BMI 32.94 kg/m   BP Readings from Last 3 Encounters:  12/25/19 128/72  11/23/18 132/86  08/05/17 130/84    Wt Readings from Last 3 Encounters:  12/25/19 236 lb 3.2 oz (107.1 kg)  11/23/18 222 lb (100.7 kg)  08/05/17 224 lb (101.6 kg)    Physical Exam Constitutional:      General: He is not in acute distress.    Appearance: He is well-developed. He is obese.     Comments: NAD  Eyes:     Conjunctiva/sclera: Conjunctivae normal.     Pupils: Pupils are equal, round, and reactive to light.  Neck:     Thyroid: No thyromegaly.     Vascular: No JVD.  Cardiovascular:     Rate and Rhythm: Normal rate and regular rhythm.  Heart sounds: Normal heart sounds. No murmur heard.  No friction rub. No gallop.   Pulmonary:     Effort: Pulmonary effort is normal. No respiratory distress.     Breath sounds: Normal breath sounds. No wheezing or rales.  Chest:     Chest wall: No tenderness.  Abdominal:     General: Bowel sounds are normal. There is no distension.     Palpations: Abdomen is soft. There is no mass.     Tenderness: There is no abdominal tenderness. There is no guarding or rebound.  Genitourinary:    Prostate: Normal.     Rectum: Normal.  Musculoskeletal:        General: No tenderness. Normal range of motion.     Cervical back: Normal range of  motion.  Lymphadenopathy:     Cervical: No cervical adenopathy.  Skin:    General: Skin is warm and dry.     Findings: No rash.  Neurological:     Mental Status: He is alert and oriented to person, place, and time.     Cranial Nerves: No cranial nerve deficit.     Motor: No abnormal muscle tone.     Coordination: Coordination normal.     Gait: Gait normal.     Deep Tendon Reflexes: Reflexes are normal and symmetric.  Psychiatric:        Behavior: Behavior normal.        Thought Content: Thought content normal.        Judgment: Judgment normal.    I spent 23 minutes in addition to time for CPX wellness examination in preparing to see the patient by review of recent labs, imaging and procedures, obtaining and reviewing separately obtained history, communicating with the patient, ordering medications, tests or procedures, and documenting clinical information in the EHR including the differential diagnosis, treatment, and any further evaluation and other management of  decreased GFR, dyslipidemia.         Lab Results  Component Value Date   WBC 4.6 11/23/2018   HGB 14.4 11/23/2018   HCT 43.4 11/23/2018   PLT 160.0 11/23/2018   GLUCOSE 83 11/23/2018   CHOL 117 11/23/2018   TRIG 95.0 11/23/2018   HDL 37.30 (L) 11/23/2018   LDLCALC 61 11/23/2018   ALT 17 11/23/2018   AST 15 11/23/2018   NA 141 11/23/2018   K 4.1 11/23/2018   CL 106 11/23/2018   CREATININE 1.17 11/23/2018   BUN 7 11/23/2018   CO2 27 11/23/2018   TSH 1.20 11/23/2018   PSA 0.24 11/23/2018   HGBA1C 6.1 04/09/2013    DG Chest 2 View  Result Date: 08/05/2017 CLINICAL DATA:  Upper chest pain shortness of breath EXAM: CHEST - 2 VIEW COMPARISON:  05/16/2015 FINDINGS: The heart size and mediastinal contours are within normal limits. Both lungs are hyperinflated but clear. The visualized skeletal structures are unremarkable. IMPRESSION: COPD without acute abnormality. Electronically Signed   By: Alcide Clever M.D.    On: 08/05/2017 16:12    Assessment & Plan:     Follow-up: No follow-ups on file.  Sonda Primes, MD

## 2019-12-25 NOTE — Patient Instructions (Signed)

## 2019-12-27 ENCOUNTER — Telehealth: Payer: Self-pay | Admitting: Internal Medicine

## 2019-12-27 ENCOUNTER — Other Ambulatory Visit: Payer: Self-pay

## 2019-12-27 ENCOUNTER — Ambulatory Visit (INDEPENDENT_AMBULATORY_CARE_PROVIDER_SITE_OTHER)
Admission: RE | Admit: 2019-12-27 | Discharge: 2019-12-27 | Disposition: A | Discharge status: Home / Self Care | Payer: Self-pay | Source: Ambulatory Visit | Attending: Internal Medicine | Admitting: Internal Medicine

## 2019-12-27 ENCOUNTER — Other Ambulatory Visit: Payer: Self-pay | Admitting: *Deleted

## 2019-12-27 DIAGNOSIS — E785 Hyperlipidemia, unspecified: Secondary | ICD-10-CM

## 2019-12-27 NOTE — Telephone Encounter (Signed)
Rec'd fax PA for pt Tramadol 50 mg. Completed via cover-my-meds. Receive msg stating " Your information has been submitted to [employer]. To check for an updated outcome later"...Raechel Chute

## 2019-12-27 NOTE — Telephone Encounter (Signed)
Received a request from Patient to mail his CT scan report to his home. 12/27/19.

## 2019-12-27 NOTE — Telephone Encounter (Deleted)
Recevied a CD of Mr. Ehrsam CT-12/27/19 to be

## 2019-12-30 ENCOUNTER — Encounter: Payer: Self-pay | Admitting: Internal Medicine

## 2019-12-30 DIAGNOSIS — R944 Abnormal results of kidney function studies: Secondary | ICD-10-CM | POA: Insufficient documentation

## 2019-12-30 NOTE — Assessment & Plan Note (Signed)
Obtain coronary calcium CT.  Continue with Lipitor

## 2019-12-30 NOTE — Assessment & Plan Note (Signed)
GFR 56.  Obtain renal ultrasound.

## 2019-12-31 NOTE — Telephone Encounter (Signed)
Rec'd fax for Notice of approval for pt Tramadol. Approved for time period 12/27/19 - 06/25/20. Faxed approval to pt pharmacy as well.Marland KitchenRaechel Chute

## 2020-04-03 ENCOUNTER — Telehealth: Payer: Self-pay

## 2020-04-03 NOTE — Telephone Encounter (Signed)
Pt reminded to complete & send in the cologuard kit for colon cancer screening.  States he believes that he still has it; instructed to send mychart message if he needs another one mailed.  Pt verb understanding.

## 2020-05-09 ENCOUNTER — Other Ambulatory Visit: Payer: Self-pay | Admitting: Internal Medicine

## 2020-05-09 DIAGNOSIS — H9313 Tinnitus, bilateral: Secondary | ICD-10-CM

## 2020-05-13 ENCOUNTER — Ambulatory Visit: Payer: BC Managed Care – PPO | Admitting: Internal Medicine

## 2020-06-27 ENCOUNTER — Other Ambulatory Visit: Payer: Self-pay | Admitting: Family Medicine

## 2020-06-27 ENCOUNTER — Telehealth: Payer: Self-pay | Admitting: Family Medicine

## 2020-06-27 NOTE — Telephone Encounter (Signed)
On call note: Nurse line called me with report of: Pt with + covid test and uri sx's with cough and fatigue, has been ill for approx 2d.  Pt requested oral antiviral.  Due to RF's of >65 and dx of copd he is at high risk for complications so oral antiviral appropriate.  GFR approx 60. I called in paxlovid to [facility] on [address] in [place].

## 2020-06-27 NOTE — Progress Notes (Signed)
A user error has taken place: encounter opened in error, closed for administrative reasons. Signed:  Santiago Bumpers, MD           06/27/2020

## 2020-06-30 NOTE — Telephone Encounter (Signed)
[facility] Report/Call: ---Caller states her husband is covid positive- tested positive via home test today. C/o chest congestion, body aches, runny nose. No fever. Sx started Wednesday.   Spoke with on call doctor.

## 2020-07-01 ENCOUNTER — Other Ambulatory Visit: Payer: Self-pay | Admitting: Internal Medicine

## 2020-07-01 MED ORDER — PAXLOVID 20 X 150 MG & 10 X 100MG PO TBPK: 3.0000 | ORAL_TABLET | Freq: Two times a day (BID) | ORAL | 0 refills | Status: DC

## 2020-07-08 ENCOUNTER — Ambulatory Visit: Payer: Self-pay | Admitting: Internal Medicine

## 2020-08-20 ENCOUNTER — Other Ambulatory Visit: Payer: Self-pay | Admitting: Internal Medicine

## 2020-08-20 NOTE — Telephone Encounter (Signed)
Check [place] registry last filled 04/21/2020../lmb  

## 2020-09-18 ENCOUNTER — Other Ambulatory Visit: Payer: Self-pay

## 2020-09-26 ENCOUNTER — Other Ambulatory Visit: Payer: Self-pay | Admitting: Internal Medicine

## 2020-09-26 MED ORDER — PAXLOVID 20 X 150 MG & 10 X 100MG PO TBPK: 3.0000 | ORAL_TABLET | Freq: Two times a day (BID) | ORAL | 0 refills | Status: DC

## 2020-10-03 ENCOUNTER — Ambulatory Visit: Payer: Self-pay | Admitting: Internal Medicine

## 2020-10-23 ENCOUNTER — Other Ambulatory Visit: Payer: Self-pay | Admitting: Internal Medicine

## 2020-11-03 DIAGNOSIS — H903 Sensorineural hearing loss, bilateral: Secondary | ICD-10-CM | POA: Insufficient documentation

## 2020-11-06 ENCOUNTER — Other Ambulatory Visit: Payer: Self-pay | Admitting: Internal Medicine

## 2020-11-06 MED ORDER — AMLODIPINE BESYLATE 5 MG PO TABS: 5.0000 mg | ORAL_TABLET | Freq: Every day | ORAL | 3 refills | Status: DC

## 2021-01-01 ENCOUNTER — Encounter: Payer: Self-pay | Admitting: Internal Medicine

## 2021-01-01 ENCOUNTER — Ambulatory Visit (INDEPENDENT_AMBULATORY_CARE_PROVIDER_SITE_OTHER): Payer: BC Managed Care – PPO | Admitting: Internal Medicine

## 2021-01-01 ENCOUNTER — Other Ambulatory Visit: Payer: Self-pay

## 2021-01-01 VITALS — BP 142/82 | HR 58 | Temp 98.3°F | Ht 71.0 in | Wt 231.2 lb

## 2021-01-01 DIAGNOSIS — Z23 Encounter for immunization: Secondary | ICD-10-CM

## 2021-01-01 DIAGNOSIS — E785 Hyperlipidemia, unspecified: Secondary | ICD-10-CM | POA: Diagnosis not present

## 2021-01-01 DIAGNOSIS — Z1211 Encounter for screening for malignant neoplasm of colon: Secondary | ICD-10-CM

## 2021-01-01 DIAGNOSIS — Z0000 Encounter for general adult medical examination without abnormal findings: Secondary | ICD-10-CM | POA: Diagnosis not present

## 2021-01-01 DIAGNOSIS — I2510 Atherosclerotic heart disease of native coronary artery without angina pectoris: Secondary | ICD-10-CM

## 2021-01-01 DIAGNOSIS — I2583 Coronary atherosclerosis due to lipid rich plaque: Secondary | ICD-10-CM

## 2021-01-01 LAB — CBC WITH DIFFERENTIAL/PLATELET
Basophils Absolute: 0 10*3/uL (ref 0.0–0.1)
Basophils Relative: 0.7 % (ref 0.0–3.0)
Eosinophils Absolute: 0.1 10*3/uL (ref 0.0–0.7)
Eosinophils Relative: 3.3 % (ref 0.0–5.0)
HCT: 44.1 % (ref 39.0–52.0)
Hemoglobin: 14.6 g/dL (ref 13.0–17.0)
Lymphocytes Relative: 31.1 % (ref 12.0–46.0)
Lymphs Abs: 1.3 10*3/uL (ref 0.7–4.0)
MCHC: 33 g/dL (ref 30.0–36.0)
MCV: 92.2 fl (ref 78.0–100.0)
Monocytes Absolute: 0.4 10*3/uL (ref 0.1–1.0)
Monocytes Relative: 8.8 % (ref 3.0–12.0)
Neutro Abs: 2.3 10*3/uL (ref 1.4–7.7)
Neutrophils Relative %: 56.1 % (ref 43.0–77.0)
Platelets: 167 10*3/uL (ref 150.0–400.0)
RBC: 4.79 Mil/uL (ref 4.22–5.81)
RDW: 13.1 % (ref 11.5–15.5)
WBC: 4.1 10*3/uL (ref 4.0–10.5)

## 2021-01-01 LAB — COMPREHENSIVE METABOLIC PANEL
ALT: 19 U/L (ref 0–53)
AST: 17 U/L (ref 0–37)
Albumin: 4.6 g/dL (ref 3.5–5.2)
Alkaline Phosphatase: 52 U/L (ref 39–117)
BUN: 12 mg/dL (ref 6–23)
CO2: 29 mEq/L (ref 19–32)
Calcium: 9.3 mg/dL (ref 8.4–10.5)
Chloride: 105 mEq/L (ref 96–112)
Creatinine, Ser: 1.4 mg/dL (ref 0.40–1.50)
GFR: 51.88 mL/min — ABNORMAL LOW (ref >60.00)
Glucose, Bld: 97 mg/dL (ref 70–99)
Potassium: 4.4 mEq/L (ref 3.5–5.1)
Sodium: 141 mEq/L (ref 135–145)
Total Bilirubin: 0.5 mg/dL (ref 0.2–1.2)
Total Protein: 7.3 g/dL (ref 6.0–8.3)

## 2021-01-01 LAB — URINALYSIS, ROUTINE W REFLEX MICROSCOPIC
Bilirubin Urine: NEGATIVE
Ketones, ur: NEGATIVE
Leukocytes,Ua: NEGATIVE
Nitrite: NEGATIVE
Specific Gravity, Urine: 1.025 (ref 1.000–1.030)
Total Protein, Urine: NEGATIVE
Urine Glucose: NEGATIVE
Urobilinogen, UA: 0.2 (ref 0.0–1.0)
pH: 5.5 (ref 5.0–8.0)

## 2021-01-01 LAB — LIPID PANEL
Cholesterol: 109 mg/dL (ref 0–200)
HDL: 39.6 mg/dL (ref >39.00)
LDL Cholesterol: 53 mg/dL (ref 0–99)
NonHDL: 69.56
Total CHOL/HDL Ratio: 3
Triglycerides: 81 mg/dL (ref 0.0–149.0)
VLDL: 16.2 mg/dL (ref 0.0–40.0)

## 2021-01-01 LAB — TSH: TSH: 1 u[IU]/mL (ref 0.35–5.50)

## 2021-01-01 LAB — PSA: PSA: 0.16 ng/mL (ref 0.10–4.00)

## 2021-01-01 MED ORDER — LORAZEPAM 0.5 MG PO TABS: ORAL_TABLET | ORAL | 1 refills | Status: DC

## 2021-01-01 MED ORDER — PANTOPRAZOLE SODIUM 40 MG PO TBEC: 40.0000 mg | DELAYED_RELEASE_TABLET | Freq: Every day | ORAL | 3 refills | Status: DC

## 2021-01-01 MED ORDER — ALBUTEROL SULFATE HFA 108 (90 BASE) MCG/ACT IN AERS: 1.0000 | INHALATION_SPRAY | RESPIRATORY_TRACT | 3 refills | Status: DC | PRN

## 2021-01-01 MED ORDER — TRAMADOL HCL 50 MG PO TABS
ORAL_TABLET | ORAL | 3 refills | Status: DC
Start: 2021-01-01 — End: 2021-07-29

## 2021-01-01 MED ORDER — ATORVASTATIN CALCIUM 10 MG PO TABS: 10.0000 mg | ORAL_TABLET | Freq: Every day | ORAL | 3 refills | Status: DC

## 2021-01-01 NOTE — Assessment & Plan Note (Signed)
2021. Coronary CT calcium score of 83. This was 50 th percentile for age and sex matched control. Cont w/Lipitor

## 2021-01-01 NOTE — Assessment & Plan Note (Signed)
  2021. Coronary CT calcium score of 83. This was 50 th percentile for age and sex matched control. Cont w/Lipitor

## 2021-01-01 NOTE — Progress Notes (Signed)
Subjective:  [name], male    DOB: 1955-08-30  Age: 65 y.o. MRN: [id number]  CC: Annual Exam   HPI Kenneth [name] presents for annual physical  Immunization History  Administered Date(s) Administered   Fluad Quad(high Dose 65+) 11/18/2018, 12/25/2019   Influenza, High Dose Seasonal PF 10/28/2020, 10/28/2020   Influenza, Seasonal, Injecte, Preservative Fre 11/24/2016   Influenza,inj,Quad PF,6+ Mos 01/02/2018   PFIZER Comirnaty(Gray Top)Covid-19 Tri-Sucrose Vaccine 08/07/2020   PFIZER(Purple Top)SARS-COV-2 Vaccination 03/22/2019, 04/12/2019, 11/19/2019   PNEUMOCOCCAL CONJUGATE-20 01/01/2021   Pfizer Covid-19 Vaccine Bivalent Booster 90yrs & up 11/04/2020   Tdap 01/31/2012, 04/09/2015   Zoster Recombinat (Shingrix) 07/22/2016, 06/11/2017     Outpatient Medications Prior to Visit  Medication Sig Dispense Refill   amLODipine (NORVASC) 5 MG tablet Take 1 tablet (5 mg total) by mouth daily. 90 tablet 3   Cholecalciferol (VITAMIN D3) 1000 UNITS CAPS Take 1 capsule (1,000 Units total) by mouth daily. 100 capsule 3   Diclofenac Sodium (PENNSAID) 2 % SOLN Place 2 application onto the skin 2 (two) times daily. 112 g 3   loratadine (CLARITIN) 10 MG tablet Take 1 tablet (10 mg total) by mouth daily. 90 tablet 5   Nirmatrelvir & Ritonavir (PAXLOVID) 20 x 150 MG & 10 x 100MG  TBPK Take 3 tablets by mouth in the morning and at bedtime. As directed on a package 30 tablet 0   albuterol (VENTOLIN HFA) 108 (90 Base) MCG/ACT inhaler Inhale 1-2 puffs into the lungs every 4 (four) hours as needed for wheezing or shortness of breath. 18 g 3   atorvastatin (LIPITOR) 10 MG tablet Take 1 tablet (10 mg total) by mouth daily. Annual appt is due must see provider for future refills 90 tablet 3   LORazepam (ATIVAN) 0.5 MG tablet TAKE 1 TABLET BY MOUTH TWICE A DAY AS NEEDED FOR ANXIETY 180 tablet 1   pantoprazole (PROTONIX) 40 MG tablet Take 1 tablet (40 mg total) by mouth daily. 90 tablet 3   traMADol  (ULTRAM) 50 MG tablet 1 po tid prn 100 tablet 3   scopolamine (TRANSDERM-SCOP) 1 MG/3DAYS PLACE 1 PATCH ONTO THE SKIN EVERY 3 DAYS. (Patient not taking: Reported on 01/01/2021) 4 patch 0   No facility-administered medications prior to visit.    ROS: Review of Systems  Constitutional:  Negative for appetite change, fatigue and unexpected weight change.  HENT:  Negative for congestion, nosebleeds, sneezing, sore throat and trouble swallowing.   Eyes:  Negative for itching and visual disturbance.  Respiratory:  Negative for cough.   Cardiovascular:  Negative for chest pain, palpitations and leg swelling.  Gastrointestinal:  Negative for abdominal distention, blood in stool, diarrhea and nausea.  Genitourinary:  Negative for frequency and hematuria.  Musculoskeletal:  Positive for arthralgias. Negative for back pain, gait problem, joint swelling and neck pain.  Skin:  Negative for rash.  Neurological:  Negative for dizziness, tremors, speech difficulty and weakness.  Psychiatric/Behavioral:  Negative for agitation, dysphoric mood and sleep disturbance. The patient is not nervous/anxious.    Objective:  BP (!) 142/82 (BP Location: Left Arm)   Pulse (!) 58   Temp 98.3 F (36.8 C) (Oral)   Ht 5\' 11"  (1.803 m)   Wt 231 lb 3.2 oz (104.9 kg)   SpO2 97%   BMI 32.25 kg/m   BP Readings from Last 3 Encounters:  01/01/21 (!) 142/82  12/25/19 128/72  11/23/18 132/86    Wt Readings from Last 3 Encounters:  01/01/21 231  lb 3.2 oz (104.9 kg)  12/25/19 236 lb 3.2 oz (107.1 kg)  11/23/18 222 lb (100.7 kg)    Physical Exam Constitutional:      General: He is not in acute distress.    Appearance: He is well-developed.     Comments: NAD  Eyes:     Conjunctiva/sclera: Conjunctivae normal.     Pupils: Pupils are equal, round, and reactive to light.  Neck:     Thyroid: No thyromegaly.     Vascular: No JVD.  Cardiovascular:     Rate and Rhythm: Normal rate and regular rhythm.     Heart  sounds: Normal heart sounds. No murmur heard.   No friction rub. No gallop.  Pulmonary:     Effort: Pulmonary effort is normal. No respiratory distress.     Breath sounds: Normal breath sounds. No wheezing or rales.  Chest:     Chest wall: No tenderness.  Abdominal:     General: Bowel sounds are normal. There is no distension.     Palpations: Abdomen is soft. There is no mass.     Tenderness: There is no abdominal tenderness. There is no guarding or rebound.  Genitourinary:    Prostate: Normal.     Rectum: Normal. Guaiac result negative.  Musculoskeletal:        General: No tenderness. Normal range of motion.     Cervical back: Normal range of motion.  Lymphadenopathy:     Cervical: No cervical adenopathy.  Skin:    General: Skin is warm and dry.     Findings: No rash.  Neurological:     Mental Status: He is alert and oriented to person, place, and time.     Cranial Nerves: No cranial nerve deficit.     Motor: No abnormal muscle tone.     Coordination: Coordination normal.     Gait: Gait normal.     Deep Tendon Reflexes: Reflexes are normal and symmetric.  Psychiatric:        Behavior: Behavior normal.        Thought Content: Thought content normal.        Judgment: Judgment normal.    Lab Results  Component Value Date   WBC 4.1 01/01/2021   HGB 14.6 01/01/2021   HCT 44.1 01/01/2021   PLT 167.0 01/01/2021   GLUCOSE 97 01/01/2021   CHOL 109 01/01/2021   TRIG 81.0 01/01/2021   HDL 39.60 01/01/2021   LDLDIRECT 68.0 12/25/2019   LDLCALC 53 01/01/2021   ALT 19 01/01/2021   AST 17 01/01/2021   NA 141 01/01/2021   K 4.4 01/01/2021   CL 105 01/01/2021   CREATININE 1.40 01/01/2021   BUN 12 01/01/2021   CO2 29 01/01/2021   TSH 1.00 01/01/2021   PSA 0.16 01/01/2021   HGBA1C 6.1 04/09/2013    CT CARDIAC SCORING  Addendum Date: 12/27/2019   ADDENDUM REPORT: 12/27/2019 13:52 CLINICAL DATA:  Risk stratification EXAM: Coronary Calcium Score TECHNIQUE: The patient was  scanned on a Siemens Somatom 64 slice scanner. Axial non-contrast 3 mm slices were carried out through the heart. The data set was analyzed on a dedicated work station and scored using the [facility]. FINDINGS: Non-cardiac: See separate report from [facility]. Ascending aorta: Normal diameter 3.1 cm Pericardium: Normal Coronary arteries: Calcium noted in proximal LAD and mid circumflex IMPRESSION: Coronary calcium score of 83. This was 61 th percentile for age and sex matched control. [place] Electronically Signed   By: Collier Salina  Eden Emms M.D.   On: 12/27/2019 13:52   Result Date: 12/27/2019 EXAM: OVER-READ INTERPRETATION  CT CHEST The following report is an over-read performed by radiologist Dr. Trudie Reed of [facility] on 12/27/2019. This over-read does not include interpretation of cardiac or coronary anatomy or pathology. The coronary calcium score/coronary CTA interpretation by the cardiologist is attached. COMPARISON:  None. FINDINGS: Within the visualized portions of the thorax there are no suspicious appearing pulmonary nodules or masses, there is no acute consolidative airspace disease, no pleural effusions, no pneumothorax and no lymphadenopathy. Visualized portions of the upper abdomen are unremarkable. There are no aggressive appearing lytic or blastic lesions noted in the visualized portions of the skeleton. IMPRESSION: No significant incidental noncardiac findings are noted. Electronically Signed: By: Trudie Reed M.D. On: 12/27/2019 10:24    Assessment & Plan:   Health Maintenance  Topic Date Due   COLONOSCOPY (Pts 45-23yrs Insurance coverage will need to be confirmed)  Never done   TETANUS/TDAP  04/08/2025   Pneumonia Vaccine 25+ Years old  Completed   INFLUENZA VACCINE  Completed   COVID-19 Vaccine  Completed   Hepatitis C Screening  Completed   Zoster Vaccines- Shingrix  Completed   HPV VACCINES  Aged Out     Problem List Items Addressed  This Visit     Coronary atherosclerosis    2021. Coronary CT calcium score of 83. This was 50 th percentile for age and sex matched control. Cont w/Lipitor      Relevant Medications   atorvastatin (LIPITOR) 10 MG tablet   Dyslipidemia     2021. Coronary CT calcium score of 83. This was 50 th percentile for age and sex matched control. Cont w/Lipitor      Relevant Medications   atorvastatin (LIPITOR) 10 MG tablet   Well adult exam - Primary     We discussed age appropriate health related issues, including available/recomended screening tests and vaccinations. We discussed a need for adhering to healthy diet and exercise. Labs were ordered to be later reviewed . All questions were answered. Prevnar in 1 mo  Pt opted for Cologuard 2021 Coronary CT calcium score of 83. This was 50 th percentile for age and sex matched control.      Relevant Orders   TSH (Completed)   Urinalysis   CBC with Differential/Platelet (Completed)   Lipid panel (Completed)   PSA (Completed)   Comprehensive metabolic panel (Completed)   Urinalysis, Routine w reflex microscopic (Completed)   Other Visit Diagnoses     Colon cancer screening       Relevant Orders   Cologuard   Need for vaccination       Relevant Orders   Pneumococcal conjugate vaccine 20-valent (Prevnar 20) (Completed)         Meds ordered this encounter  Medications   albuterol (VENTOLIN HFA) 108 (90 Base) MCG/ACT inhaler    Sig: Inhale 1-2 puffs into the lungs every 4 (four) hours as needed for wheezing or shortness of breath.    Dispense:  18 g    Refill:  3   atorvastatin (LIPITOR) 10 MG tablet    Sig: Take 1 tablet (10 mg total) by mouth daily. Annual appt is due must see provider for future refills    Dispense:  90 tablet    Refill:  3   LORazepam (ATIVAN) 0.5 MG tablet    Sig: TAKE 1 TABLET BY MOUTH TWICE A DAY AS NEEDED FOR ANXIETY    Dispense:  180 tablet    Refill:  1    This request is for a new prescription  for a controlled substance as required by Federal/State law.   pantoprazole (PROTONIX) 40 MG tablet    Sig: Take 1 tablet (40 mg total) by mouth daily.    Dispense:  90 tablet    Refill:  3   traMADol (ULTRAM) 50 MG tablet    Sig: 1 po tid prn    Dispense:  100 tablet    Refill:  3      Follow-up: Return in about 6 months (around 07/01/2021) for a follow-up visit.  Walker Kehr, MD

## 2021-01-01 NOTE — Assessment & Plan Note (Addendum)
  We discussed age appropriate health related issues, including available/recomended screening tests and vaccinations. We discussed a need for adhering to healthy diet and exercise. Labs were ordered to be later reviewed . All questions were answered. Prevnar in 1 mo  Pt opted for Cologuard 2021 Coronary CT calcium score of 83. This was 50 th percentile for age and sex matched control.

## 2021-01-13 ENCOUNTER — Telehealth: Payer: Self-pay

## 2021-01-13 NOTE — Telephone Encounter (Signed)
PA started.   Key: [id number]

## 2021-01-26 ENCOUNTER — Ambulatory Visit: Payer: Self-pay | Admitting: Internal Medicine

## 2021-03-13 ENCOUNTER — Encounter: Payer: Self-pay | Admitting: Internal Medicine

## 2021-03-19 ENCOUNTER — Other Ambulatory Visit: Payer: Self-pay

## 2021-03-19 ENCOUNTER — Ambulatory Visit (INDEPENDENT_AMBULATORY_CARE_PROVIDER_SITE_OTHER): Payer: BC Managed Care – PPO

## 2021-03-19 ENCOUNTER — Encounter: Payer: Self-pay | Admitting: Nurse Practitioner

## 2021-03-19 ENCOUNTER — Ambulatory Visit: Payer: BC Managed Care – PPO | Admitting: Nurse Practitioner

## 2021-03-19 ENCOUNTER — Ambulatory Visit: Payer: BC Managed Care – PPO | Admitting: Internal Medicine

## 2021-03-19 VITALS — BP 142/82 | HR 73 | Temp 97.9°F | Ht 71.0 in | Wt 231.4 lb

## 2021-03-19 DIAGNOSIS — I10 Essential (primary) hypertension: Secondary | ICD-10-CM

## 2021-03-19 DIAGNOSIS — M5412 Radiculopathy, cervical region: Secondary | ICD-10-CM | POA: Diagnosis not present

## 2021-03-19 NOTE — Patient Instructions (Signed)
Stay on the 5mg  of amlodipine for the next week, monitor blood pressure. If after one more week your blood pressure remains >140/>90 increase to 10mg  of amlodipine by mouth daily. If you feel lightheaded/dizzy and this is mild try to stay on the 10mg /day of amlodipine for 2 weeks. If you still feel lightheaded/dizzy then call our office and we will consider another treatment option. If you try the 10mg  of amlodipine by mouth daily and you feel extremely dizzy/lightheaded please call before the 2 week trial period.

## 2021-03-19 NOTE — Progress Notes (Addendum)
Subjective:  [name], male    DOB: 01/31/1956  Age: 66 y.o. MRN: [id number]  CC:  Chief Complaint  Patient presents with   high blood pressure    Rm 6.    Shoulder Pain    Right shoulder pain, pt believes he pulled something when he went on vacation October 19th, 2022. Pt also mentions tingling starting on his right upper back radiating over the shoulder to the chest.       HPI  This patient arrives today for the above.  High blood pressure: He is currently on amlodipine 5 mg a mouth daily and has been taking this dose for about 1 week.  He tells me he has been checking his blood pressure at home and systolic blood pressure has run anywhere between 1 [id number] 60 and diastolic blood pressure is mainly in the 80s to 90s.  He has been feeling some dizziness and lightheadedness since starting the amlodipine, however today he is not experiencing these side effects.  He is concerned about his blood pressure would like to get a better controlled and wants to be evaluated further.  He is also motivated to make lifestyle changes regarding improving his blood pressure.  He has already made changes in his diet and is exercising on a routine basis.  Shoulder pain: He has been experiencing right shoulder pain the last few months.  He does have a history of cervical radiculopathy on the left side, but now is experiencing on the right side.  He denies any weakness in his hand or arm as well as tingling in his hand.  Past Medical History:  Diagnosis Date   COPD (chronic obstructive pulmonary disease) (HCC)    GERD (gastroesophageal reflux disease)    Low back pain    L4-5 disk      Family History  Problem Relation Age of Onset   Cancer Father        lymphoma    Social History   Social History Narrative   Not on file   Social History   Tobacco Use   Smoking status: Former   Smokeless tobacco: Never  Substance Use Topics   Alcohol use: No     Current Meds   Medication Sig   albuterol (VENTOLIN HFA) 108 (90 Base) MCG/ACT inhaler Inhale 1-2 puffs into the lungs every 4 (four) hours as needed for wheezing or shortness of breath.   amLODipine (NORVASC) 5 MG tablet Take 1 tablet (5 mg total) by mouth daily.   atorvastatin (LIPITOR) 10 MG tablet Take 1 tablet (10 mg total) by mouth daily. Annual appt is due must see provider for future refills   Cholecalciferol (VITAMIN D3) 1000 UNITS CAPS Take 1 capsule (1,000 Units total) by mouth daily.   Diclofenac Sodium (PENNSAID) 2 % SOLN Place 2 application onto the skin 2 (two) times daily.   loratadine (CLARITIN) 10 MG tablet Take 1 tablet (10 mg total) by mouth daily.   LORazepam (ATIVAN) 0.5 MG tablet TAKE 1 TABLET BY MOUTH TWICE A DAY AS NEEDED FOR ANXIETY   pantoprazole (PROTONIX) 40 MG tablet Take 1 tablet (40 mg total) by mouth daily.   traMADol (ULTRAM) 50 MG tablet 1 po tid prn   [DISCONTINUED] Nirmatrelvir & Ritonavir (PAXLOVID) 20 x 150 MG & 10 x 100MG  TBPK Take 3 tablets by mouth in the morning and at bedtime. As directed on a package    ROS:  Review of Systems  Eyes:  Negative for blurred vision and double vision.  Respiratory:  Negative for shortness of breath.   Cardiovascular:  Negative for chest pain and palpitations.  Neurological:  Positive for dizziness.    Objective:   Today's Vitals: BP (!) 142/82    Pulse 73    Temp 97.9 F (36.6 C) (Oral)    Ht 5\' 11"  (1.803 m)    Wt 231 lb 6.4 oz (105 kg)    SpO2 97%    BMI 32.27 kg/m  Vitals with BMI 03/19/2021 01/01/2021 12/25/2019  Height 5\' 11"  5\' 11"  -  Weight 231 lbs 6 oz 231 lbs 3 oz 236 lbs 3 oz  BMI [id number] -  Systolic A999333 [id number]  Diastolic 82 82 72  Pulse 73 58 69     Physical Exam Vitals reviewed.  Constitutional:      Appearance: Normal appearance.  HENT:     Head: Normocephalic and atraumatic.  Cardiovascular:     Rate and Rhythm: Normal rate and regular rhythm.  Pulmonary:     Effort: Pulmonary effort is normal.      Breath sounds: Normal breath sounds.  Musculoskeletal:     Cervical back: Neck supple.  Skin:    General: Skin is warm and dry.  Neurological:     Mental Status: He is alert and oriented to person, place, and time.  Psychiatric:        Mood and Affect: Mood normal.        Behavior: Behavior normal.        Thought Content: Thought content normal.        Judgment: Judgment normal.         Assessment and Plan   1. Hypertension, unspecified type   2. Cervical radiculopathy      Plan: 1.  Blood pressure is above goal and uncontrolled.  He is tolerating the amlodipine fairly well at this time.  We had a long discussion regarding treatment options such as increasing amlodipine versus adding additional agent versus changing from amlodipine to something else.  Per shared decision making he will stay on the 5 mg of amlodipine for the next week and monitor blood pressure at home.  If blood pressure remains above goal (defined as >140/>85) he will increase his amlodipine dose to 10 mg by mouth daily, and will trial this for approximately 2 weeks.  He was told if he starts to experience severe lightheadedness or dizziness, then he should call our office at which point may try a different agent altogether.  He also is going to try taking his medication at night, we did discuss fall risk if he takes the medicine at night but he does not feel he is at risk for falls currently and feels comfortable trying to take the medicine at night. 2. Will order xray of the cervical spine.   Tests ordered Orders Placed This Encounter  Procedures   DG Cervical Spine Complete      No orders of the defined types were placed in this encounter.   Patient to follow-up in 3-4 weeks to recheck blood pressure/determine how he is tolerating the amlodipine.  Ailene Ards, NP

## 2021-03-27 ENCOUNTER — Encounter: Payer: Self-pay | Admitting: Nurse Practitioner

## 2021-03-27 DIAGNOSIS — M5412 Radiculopathy, cervical region: Secondary | ICD-10-CM

## 2021-04-08 ENCOUNTER — Encounter: Payer: Self-pay | Admitting: Internal Medicine

## 2021-04-08 ENCOUNTER — Ambulatory Visit (INDEPENDENT_AMBULATORY_CARE_PROVIDER_SITE_OTHER): Payer: BC Managed Care – PPO | Admitting: Internal Medicine

## 2021-04-08 ENCOUNTER — Other Ambulatory Visit: Payer: Self-pay

## 2021-04-08 VITALS — BP 148/88 | HR 60 | Temp 97.9°F | Ht 71.0 in | Wt 229.1 lb

## 2021-04-08 DIAGNOSIS — R42 Dizziness and giddiness: Secondary | ICD-10-CM | POA: Diagnosis not present

## 2021-04-08 DIAGNOSIS — I10 Essential (primary) hypertension: Secondary | ICD-10-CM | POA: Diagnosis not present

## 2021-04-08 DIAGNOSIS — H9313 Tinnitus, bilateral: Secondary | ICD-10-CM

## 2021-04-08 MED ORDER — OLMESARTAN MEDOXOMIL 20 MG PO TABS: 20.0000 mg | ORAL_TABLET | Freq: Every day | ORAL | 3 refills | Status: DC

## 2021-04-08 NOTE — Assessment & Plan Note (Signed)
C/o dizziness on Norvasc - d/c Start Olmesartan po

## 2021-04-08 NOTE — Patient Instructions (Signed)
Sign up for [employer] [employer] library ( via Libby app on your phone or your ipad). If you don't have a library card  - go to any library branch. They will set you up in 15 minutes. It is free. You can check out books to read and to listen, check out magazines and newspapers, movies etc.   The Obesity Code book by Jason Fung   These suggestions will probably help you to improve your metabolism if you are not overweight and to lose weight if you are overweight: 1.  Reduce your consumption of sugars and starches.  Eliminate high fructose corn syrup from your diet.  Reduce your consumption of processed foods.  For desserts try to have seasonal fruits, berries, nuts, cheeses or dark chocolate with more than 70% cacao. 2.  Do not snack 3.  You do not have to eat breakfast.  If you choose to have breakfast - eat plain greek yogurt, eggs, oatmeal (without sugar) - use honey if you need to. 4.  Drink water, freshly brewed unsweetened tea (green, black or herbal) or coffee.  Do not drink sodas including diet sodas , juices, beverages sweetened with artificial sweeteners. 5.  Reduce your consumption of refined grains. 6.  Avoid protein drinks such as Optifast, Slim fast etc. Eat chicken, fish, meat, dairy and beans for your sources of protein. 7.  Natural unprocessed fats like cold pressed virgin olive oil, butter, coconut oil are good for you.  Eat avocados. 8.  Increase your consumption of fiber.  Fruits, berries, vegetables, whole grains, flaxseed, chia seeds, beans, popcorn, nuts, oatmeal are good sources of fiber 9.  Use vinegar in your diet, i.e. apple cider vinegar, red wine or balsamic vinegar 10.  You can try fasting.  For example you can skip breakfast and lunch every other day (24-hour fast) 11.  Stress reduction, good night sleep, relaxation, meditation, yoga and other physical activity is likely to help you to maintain low weight too. 12.  If you drink alcohol, limit your alcohol intake to no more than  2 drinks a day.  

## 2021-04-08 NOTE — Assessment & Plan Note (Addendum)
Chronic and refractory vertigo and tinnitus.  The patient was referred to Dr. Howard Francis at [facility] °

## 2021-04-09 NOTE — Progress Notes (Signed)
[name], male    DOB: 1956/01/31  Age: 66 y.o. MRN: [id number]   CC: Follow-up (Blood pressure seen Kenneth [name] 2 weeks ago) and Dizziness     HPI Kenneth [name] presents for HTN. SBP 140-160. DBP 80-95. C/o elevated BP since Jan 2023. C/o dizziness on Norvasc C/o tinnitus         Outpatient Medications Prior to Visit  Medication Sig Dispense Refill   albuterol (VENTOLIN HFA) 108 (90 Base) MCG/ACT inhaler Inhale 1-2 puffs into the lungs every 4 (four) hours as needed for wheezing or shortness of breath. 18 g 3   atorvastatin (LIPITOR) 10 MG tablet Take 1 tablet (10 mg total) by mouth daily. Annual appt is due must see provider for future refills 90 tablet 3   Cholecalciferol (VITAMIN D3) 1000 UNITS CAPS Take 1 capsule (1,000 Units total) by mouth daily. 100 capsule 3   Diclofenac Sodium (PENNSAID) 2 % SOLN Place 2 application onto the skin 2 (two) times daily. 112 g 3   loratadine (CLARITIN) 10 MG tablet Take 1 tablet (10 mg total) by mouth daily. 90 tablet 5   LORazepam (ATIVAN) 0.5 MG tablet TAKE 1 TABLET BY MOUTH TWICE A DAY AS NEEDED FOR ANXIETY 180 tablet 1   pantoprazole (PROTONIX) 40 MG tablet Take 1 tablet (40 mg total) by mouth daily. 90 tablet 3   traMADol (ULTRAM) 50 MG tablet 1 po tid prn 100 tablet 3   amLODipine (NORVASC) 5 MG tablet Take 1 tablet (5 mg total) by mouth daily. 90 tablet 3    No facility-administered medications prior to visit.      ROS: Review of Systems  Constitutional:  Negative for appetite change, fatigue and unexpected weight change.  HENT:  Negative for congestion, nosebleeds, sneezing, sore throat and trouble swallowing.   Eyes:  Negative for itching and visual disturbance.  Respiratory:  Negative for cough.   Cardiovascular:  Negative for chest pain, palpitations and leg swelling.  Gastrointestinal:  Negative for abdominal distention, blood in stool, diarrhea and nausea.  Genitourinary:  Negative for frequency and hematuria.  Musculoskeletal:   Negative for back pain, gait problem, joint swelling and neck pain.  Skin:  Negative for rash.  Neurological:  Positive for dizziness and light-headedness. Negative for tremors, speech difficulty and weakness.  Psychiatric/Behavioral:  Negative for agitation, dysphoric mood and sleep disturbance. The patient is not nervous/anxious.     Objective:  BP (!) 148/88    Pulse 60    Temp 97.9 F (36.6 C) (Oral)    Ht 5\' 11"  (1.803 m)    Wt 229 lb 2 oz (103.9 kg)    SpO2 94%    BMI 31.96 kg/m       BP Readings from Last 3 Encounters:  04/08/21 (!) 148/88  03/19/21 (!) 142/82  01/01/21 (!) 142/82         Wt Readings from Last 3 Encounters:  04/08/21 229 lb 2 oz (103.9 kg)  03/19/21 231 lb 6.4 oz (105 kg)  01/01/21 231 lb 3.2 oz (104.9 kg)      Physical Exam Constitutional:      General: He is not in acute distress.    Appearance: He is well-developed.     Comments: NAD  Eyes:     Conjunctiva/sclera: Conjunctivae normal.     Pupils: Pupils are equal, round, and reactive to light.  Neck:     Thyroid: No thyromegaly.     Vascular: No JVD.  Cardiovascular:  Rate and Rhythm: Normal rate and regular rhythm.     Heart sounds: Normal heart sounds. No murmur heard.   No friction rub. No gallop.  Pulmonary:     Effort: Pulmonary effort is normal. No respiratory distress.     Breath sounds: Normal breath sounds. No wheezing or rales.  Chest:     Chest wall: No tenderness.  Abdominal:     General: Bowel sounds are normal. There is no distension.     Palpations: Abdomen is soft. There is no mass.     Tenderness: There is no abdominal tenderness. There is no guarding or rebound.  Musculoskeletal:        General: No tenderness. Normal range of motion.     Cervical back: Normal range of motion.  Lymphadenopathy:     Cervical: No cervical adenopathy.  Skin:    General: Skin is warm and dry.     Findings: No rash.  Neurological:     Mental Status: He is alert and oriented to person,  place, and time.     Cranial Nerves: No cranial nerve deficit.     Motor: No abnormal muscle tone.     Coordination: Coordination normal.     Gait: Gait normal.     Deep Tendon Reflexes: Reflexes are normal and symmetric.  Psychiatric:        Behavior: Behavior normal.        Thought Content: Thought content normal.        Judgment: Judgment normal.      Recent Labs       Lab Results  Component Value Date    WBC 4.1 01/01/2021    HGB 14.6 01/01/2021    HCT 44.1 01/01/2021    PLT 167.0 01/01/2021    GLUCOSE 97 01/01/2021    CHOL 109 01/01/2021    TRIG 81.0 01/01/2021    HDL 39.60 01/01/2021    LDLDIRECT 68.0 12/25/2019    LDLCALC 53 01/01/2021    ALT 19 01/01/2021    AST 17 01/01/2021    NA 141 01/01/2021    K 4.4 01/01/2021    CL 105 01/01/2021    CREATININE 1.40 01/01/2021    BUN 12 01/01/2021    CO2 29 01/01/2021    TSH 1.00 01/01/2021    PSA 0.16 01/01/2021    HGBA1C 6.1 04/09/2013        CT CARDIAC SCORING   Addendum Date: 12/27/2019   ADDENDUM REPORT: 12/27/2019 13:52 CLINICAL DATA:  Risk stratification EXAM: Coronary Calcium Score TECHNIQUE: The patient was scanned on a Siemens Somatom 64 slice scanner. Axial non-contrast 3 mm slices were carried out through the heart. The data set was analyzed on a dedicated work station and scored using the [employer]. FINDINGS: Non-cardiac: See separate report from [facility]. Ascending aorta: Normal diameter 3.1 cm Pericardium: Normal Coronary arteries: Calcium noted in proximal LAD and mid circumflex IMPRESSION: Coronary calcium score of 83. This was 35 th percentile for age and sex matched control. Jenkins Rouge Electronically Signed   By: Jenkins Rouge M.D.   On: 12/27/2019 13:52    Result Date: 12/27/2019 EXAM: OVER-READ INTERPRETATION  CT CHEST The following report is an over-read performed by radiologist Dr. Vinnie Langton of [facility], [place] on 12/27/2019. This over-read does not include  interpretation of cardiac or coronary anatomy or pathology. The coronary calcium score/coronary CTA interpretation by the cardiologist is attached. COMPARISON:  None. FINDINGS: Within the visualized portions of the thorax there are  no suspicious appearing pulmonary nodules or masses, there is no acute consolidative airspace disease, no pleural effusions, no pneumothorax and no lymphadenopathy. Visualized portions of the upper abdomen are unremarkable. There are no aggressive appearing lytic or blastic lesions noted in the visualized portions of the skeleton. IMPRESSION: No significant incidental noncardiac findings are noted. Electronically Signed: By: Vinnie Langton M.D. On: 12/27/2019 10:24      Assessment & Plan:    Problem List Items Addressed This Visit       Dizziness and giddiness      ENT ref - [facility]        HTN (hypertension)      C/o dizziness on Norvasc - d/c Start Olmesartan po        Relevant Medications    olmesartan (BENICAR) 20 MG tablet            Meds ordered this encounter  Medications   olmesartan (BENICAR) 20 MG tablet      Sig: Take 1 tablet (20 mg total) by mouth daily.      Dispense:  90 tablet      Refill:  3    See assessment and plan   Follow-up: Return in about 4 weeks (around 05/06/2021) for a follow-up visit.   Walker Kehr, MD

## 2021-04-09 NOTE — Assessment & Plan Note (Signed)
Chronic and refractory vertigo and tinnitus.  The patient was referred to Dr. Leonia Corona at [facility]

## 2021-04-25 ENCOUNTER — Other Ambulatory Visit: Payer: BC Managed Care – PPO

## 2021-05-22 ENCOUNTER — Other Ambulatory Visit: Payer: Self-pay | Admitting: Internal Medicine

## 2021-05-25 ENCOUNTER — Encounter: Payer: Self-pay | Admitting: Internal Medicine

## 2021-05-25 NOTE — Telephone Encounter (Signed)
Pls advise if ok../lmb 

## 2021-05-26 ENCOUNTER — Other Ambulatory Visit: Payer: Self-pay | Admitting: Internal Medicine

## 2021-05-26 MED ORDER — SCOPOLAMINE 1 MG/3DAYS TD PT72: 1.0000 | MEDICATED_PATCH | TRANSDERMAL | 1 refills | Status: DC

## 2021-07-01 ENCOUNTER — Encounter: Payer: Self-pay | Admitting: Internal Medicine

## 2021-07-02 NOTE — Telephone Encounter (Signed)
Pt is wanting advisement from covering provider concerning email,,,/lmb

## 2021-07-07 ENCOUNTER — Ambulatory Visit (INDEPENDENT_AMBULATORY_CARE_PROVIDER_SITE_OTHER): Payer: BC Managed Care – PPO | Admitting: Internal Medicine

## 2021-07-07 ENCOUNTER — Encounter: Payer: Self-pay | Admitting: Internal Medicine

## 2021-07-07 DIAGNOSIS — R6884 Jaw pain: Secondary | ICD-10-CM | POA: Diagnosis not present

## 2021-07-07 DIAGNOSIS — E785 Hyperlipidemia, unspecified: Secondary | ICD-10-CM

## 2021-07-07 DIAGNOSIS — M25511 Pain in right shoulder: Secondary | ICD-10-CM | POA: Diagnosis not present

## 2021-07-07 DIAGNOSIS — R42 Dizziness and giddiness: Secondary | ICD-10-CM

## 2021-07-07 DIAGNOSIS — M25512 Pain in left shoulder: Secondary | ICD-10-CM

## 2021-07-07 MED ORDER — OLMESARTAN MEDOXOMIL 40 MG PO TABS: 40.0000 mg | ORAL_TABLET | Freq: Every day | ORAL | 3 refills | Status: DC

## 2021-07-07 NOTE — Assessment & Plan Note (Addendum)
Shoulder hurts up lifting since October..pain rating at 3-4/10 after lifting 70 lbs suitcase). Blue-Emu cream was recommended to use 2-3 times a day  R biceps tendon is tender (tendonitis) -- pt declined inj

## 2021-07-07 NOTE — Patient Instructions (Signed)
Blue-Emu cream -- use 2-3 times a day ? ?

## 2021-07-07 NOTE — Assessment & Plan Note (Addendum)
New R jaw pain (Started 3 weeks ago after chewing on a piece of meat - something popped - he saw his dentist --pain has subsided by 90% on Aleve ). Blue-Emu cream was recommended to use 2-3 times a day

## 2021-07-07 NOTE — Progress Notes (Signed)
Subjective:  [name], male    DOB: 10/10/1955  Age: 66 y.o. MRN: [id number]  CC: Jaw Pain (Started 3 weeks ago right, pain has subsided  ) and Shoulder Pain (Shoulder hurts up lifting since October..pain rating at 3/4/  )   HPI Kenneth SeverinRoy Defreitas presents for R jaw pain (Started 3 weeks ago after chewing on a piece of meat - something popped - he saw his dentist --pain has subsided by 90% on Aleve ). C/o R shoulder pain (Shoulder hurts up lifting since October..pain rating at 3-4/10 after lifting 70 lbs suitcase). F/u on HTN SBP 129-164 lately  Outpatient Medications Prior to Visit  Medication Sig Dispense Refill   albuterol (VENTOLIN HFA) 108 (90 Base) MCG/ACT inhaler Inhale 1-2 puffs into the lungs every 4 (four) hours as needed for wheezing or shortness of breath. 18 g 3   atorvastatin (LIPITOR) 10 MG tablet Take 1 tablet (10 mg total) by mouth daily. Annual appt is due must see provider for future refills 90 tablet 3   Cholecalciferol (VITAMIN D3) 1000 UNITS CAPS Take 1 capsule (1,000 Units total) by mouth daily. 100 capsule 3   Diclofenac Sodium (PENNSAID) 2 % SOLN Place 2 application onto the skin 2 (two) times daily. 112 g 3   loratadine (CLARITIN) 10 MG tablet Take 1 tablet (10 mg total) by mouth daily. 90 tablet 5   LORazepam (ATIVAN) 0.5 MG tablet TAKE 1 TABLET BY MOUTH TWICE A DAY AS NEEDED FOR ANXIETY 180 tablet 1   pantoprazole (PROTONIX) 40 MG tablet Take 1 tablet (40 mg total) by mouth daily. 90 tablet 3   scopolamine (TRANSDERM-SCOP) 1 MG/3DAYS Place 1 patch (1.5 mg total) onto the skin every 3 (three) days. 4 patch 1   traMADol (ULTRAM) 50 MG tablet 1 po tid prn 100 tablet 3   olmesartan (BENICAR) 20 MG tablet Take 1 tablet (20 mg total) by mouth daily. 90 tablet 3   No facility-administered medications prior to visit.    ROS: Review of Systems  Constitutional:  Negative for appetite change, fatigue and unexpected weight change.  HENT:  Negative for congestion,  nosebleeds, sneezing, sore throat and trouble swallowing.   Eyes:  Negative for itching and visual disturbance.  Respiratory:  Negative for cough.   Cardiovascular:  Negative for chest pain, palpitations and leg swelling.  Gastrointestinal:  Negative for abdominal distention, blood in stool, diarrhea and nausea.  Genitourinary:  Negative for frequency and hematuria.  Musculoskeletal:  Negative for back pain, gait problem, joint swelling and neck pain.  Skin:  Negative for rash.  Neurological:  Negative for dizziness, tremors, speech difficulty and weakness.  Psychiatric/Behavioral:  Negative for agitation, dysphoric mood, sleep disturbance and suicidal ideas. The patient is not nervous/anxious.    Objective:  BP 126/78 (BP Location: Left Arm, Patient Position: Sitting, Cuff Size: Large)   Pulse (!) 55   Temp 98.9 F (37.2 C) (Oral)   Ht 5\' 11"  (1.803 m)   Wt 232 lb 3.2 oz (105.3 kg)   SpO2 99%   BMI 32.39 kg/m   BP Readings from Last 3 Encounters:  07/07/21 126/78  04/08/21 (!) 148/88  03/19/21 (!) 142/82    Wt Readings from Last 3 Encounters:  07/07/21 232 lb 3.2 oz (105.3 kg)  04/08/21 229 lb 2 oz (103.9 kg)  03/19/21 231 lb 6.4 oz (105 kg)    Physical Exam Constitutional:      General: He is not in acute distress.  Appearance: He is well-developed. He is obese.     Comments: NAD  Eyes:     Conjunctiva/sclera: Conjunctivae normal.     Pupils: Pupils are equal, round, and reactive to light.  Neck:     Thyroid: No thyromegaly.     Vascular: No JVD.  Cardiovascular:     Rate and Rhythm: Normal rate and regular rhythm.     Heart sounds: Normal heart sounds. No murmur heard.   No friction rub. No gallop.  Pulmonary:     Effort: Pulmonary effort is normal. No respiratory distress.     Breath sounds: Normal breath sounds. No wheezing or rales.  Chest:     Chest wall: No tenderness.  Abdominal:     General: Bowel sounds are normal. There is no distension.      Palpations: Abdomen is soft. There is no mass.     Tenderness: There is no abdominal tenderness. There is no guarding or rebound.  Musculoskeletal:        General: No tenderness. Normal range of motion.     Cervical back: Normal range of motion.  Lymphadenopathy:     Cervical: No cervical adenopathy.  Skin:    General: Skin is warm and dry.     Findings: No rash.  Neurological:     Mental Status: He is alert and oriented to person, place, and time.     Cranial Nerves: No cranial nerve deficit.     Motor: No abnormal muscle tone.     Coordination: Coordination normal.     Gait: Gait normal.     Deep Tendon Reflexes: Reflexes are normal and symmetric.  Psychiatric:        Behavior: Behavior normal.        Thought Content: Thought content normal.        Judgment: Judgment normal.   R biceps tendon is tender Jaw NT  Lab Results  Component Value Date   WBC 4.1 01/01/2021   HGB 14.6 01/01/2021   HCT 44.1 01/01/2021   PLT 167.0 01/01/2021   GLUCOSE 97 01/01/2021   CHOL 109 01/01/2021   TRIG 81.0 01/01/2021   HDL 39.60 01/01/2021   LDLDIRECT 68.0 12/25/2019   LDLCALC 53 01/01/2021   ALT 19 01/01/2021   AST 17 01/01/2021   NA 141 01/01/2021   K 4.4 01/01/2021   CL 105 01/01/2021   CREATININE 1.40 01/01/2021   BUN 12 01/01/2021   CO2 29 01/01/2021   TSH 1.00 01/01/2021   PSA 0.16 01/01/2021   HGBA1C 6.1 04/09/2013    CT CARDIAC SCORING  Addendum Date: 12/27/2019   ADDENDUM REPORT: 12/27/2019 13:52 CLINICAL DATA:  Risk stratification EXAM: Coronary Calcium Score TECHNIQUE: The patient was scanned on a Siemens Somatom 64 slice scanner. Axial non-contrast 3 mm slices were carried out through the heart. The data set was analyzed on a dedicated work station and scored using the [employer] method. FINDINGS: Non-cardiac: See separate report from [facility]. Ascending aorta: Normal diameter 3.1 cm Pericardium: Normal Coronary arteries: Calcium noted in proximal LAD and mid  circumflex IMPRESSION: Coronary calcium score of 83. This was 50 th percentile for age and sex matched control. Charlton Haws Electronically Signed   By: Charlton Haws M.D.   On: 12/27/2019 13:52   Result Date: 12/27/2019 EXAM: OVER-READ INTERPRETATION  CT CHEST The following report is an over-read performed by radiologist Dr. Trudie Reed of [facility] on 12/27/2019. This over-read does not include interpretation of cardiac or coronary  anatomy or pathology. The coronary calcium score/coronary CTA interpretation by the cardiologist is attached. COMPARISON:  None. FINDINGS: Within the visualized portions of the thorax there are no suspicious appearing pulmonary nodules or masses, there is no acute consolidative airspace disease, no pleural effusions, no pneumothorax and no lymphadenopathy. Visualized portions of the upper abdomen are unremarkable. There are no aggressive appearing lytic or blastic lesions noted in the visualized portions of the skeleton. IMPRESSION: No significant incidental noncardiac findings are noted. Electronically Signed: By: Trudie Reed M.D. On: 12/27/2019 10:24    Assessment & Plan:   Problem List Items Addressed This Visit     SHOULDER PAIN    Shoulder hurts up lifting since October..pain rating at 3-4/10 after lifting 70 lbs suitcase). Blue-Emu cream was recommended to use 2-3 times a day  R biceps tendon is tender (tendonitis) -- pt declined inj       Dizziness and giddiness     Chronic Appt w/Dr. Leonia Corona at [facility] - pending      Dyslipidemia    Cont w/Lipitor      Jaw pain     New R jaw pain (Started 3 weeks ago after chewing on a piece of meat - something popped - he saw his dentist --pain has subsided by 90% on Aleve ). Blue-Emu cream was recommended to use 2-3 times a day          Meds ordered this encounter  Medications   olmesartan (BENICAR) 40 MG tablet    Sig: Take 1 tablet (40 mg total) by mouth daily.     Dispense:  90 tablet    Refill:  3      Follow-up: Return in about 6 months (around 01/07/2022) for a follow-up visit.  Sonda Primes, MD

## 2021-07-07 NOTE — Assessment & Plan Note (Signed)
Chronic Appt w/Dr. Leonia Corona at [facility] - pending

## 2021-07-07 NOTE — Assessment & Plan Note (Signed)
Cont w/Lipitor 

## 2021-07-25 ENCOUNTER — Other Ambulatory Visit: Payer: Self-pay | Admitting: Internal Medicine

## 2021-07-27 NOTE — Telephone Encounter (Signed)
Check [place] registry last filled 05/22/2021...Raechel Chute

## 2021-09-14 ENCOUNTER — Other Ambulatory Visit: Payer: Self-pay | Admitting: Internal Medicine

## 2021-09-14 NOTE — Telephone Encounter (Signed)
Check [place] registry last filled 05/01/2021../l,mb

## 2021-09-24 ENCOUNTER — Ambulatory Visit (HOSPITAL_COMMUNITY)
Admission: EM | Admit: 2021-09-24 | Discharge: 2021-09-24 | Disposition: A | Discharge status: Home / Self Care | Payer: BC Managed Care – PPO | Attending: Internal Medicine | Admitting: Internal Medicine

## 2021-09-24 ENCOUNTER — Encounter (HOSPITAL_COMMUNITY): Payer: Self-pay | Admitting: Emergency Medicine

## 2021-09-24 DIAGNOSIS — T63441A Toxic effect of venom of bees, accidental (unintentional), initial encounter: Secondary | ICD-10-CM | POA: Diagnosis not present

## 2021-09-24 MED ORDER — PREDNISONE 20 MG PO TABS: 20.0000 mg | ORAL_TABLET | Freq: Every day | ORAL | 0 refills | Status: AC

## 2021-09-24 NOTE — Discharge Instructions (Addendum)
Please medications as prescribed Cold compress over the swollen area will help reduce the swelling Return to urgent care if symptoms worsen

## 2021-09-24 NOTE — ED Triage Notes (Signed)
Pt reports getting stung by a yellow jacket yesterday on the right cheek around 8 pm lastnight. States he noticed the facial swelling immediately. Reports taking 25 mg of Benadryl today around 4 pm. Denies oral swelling or trouble breathing.

## 2021-09-26 NOTE — ED Provider Notes (Signed)
[facility]    CSN: [id number] Arrival date & time: 09/24/21  1939      History   Chief Complaint Chief Complaint  Patient presents with   Insect Bite    HPI Kenneth [name] is a 66 y.o. male comes to the urgent care with weakness.  Patient was stung by a bee yesterday around 4 PM.  He was moving his lawn when he stepped up and next resulted in an insect bite.  He denies any feeling of swelling, chest tightening, shortness of breath, tongue swelling or lip swelling.  He denies any right-sided facial pain of the time.  Right after the bite the patient took a dose of Benadryl.  When the patient woke up this morning he noticed swelling on the right side of the face with no significant erythema.  He was able to sleep.  He comes to the urgent care because of worsening swelling of the right.  Swelling is not pruritic.  He does not have any lip swelling or tongue swelling.   HPI  Past Medical History:  Diagnosis Date   COPD (chronic obstructive pulmonary disease) (HCC)    GERD (gastroesophageal reflux disease)    Low back pain    L4-5 disk    Patient Active Problem List   Diagnosis Date Noted   Jaw pain 07/07/2021   HTN (hypertension) 04/08/2021   Coronary atherosclerosis 01/01/2021   Sensorineural hearing loss (SNHL), bilateral 11/03/2020   Decreased GFR 12/30/2019   Rash and nonspecific skin eruption 08/05/2017   Cervical radiculopathy 08/23/2016   Nonallopathic lesion of cervical region 08/23/2016   Nonallopathic lesion of thoracic region 08/23/2016   Nonallopathic lesion of rib cage 08/23/2016   Acute shoulder bursitis, left 08/23/2016   LVH (left ventricular hypertrophy) 02/20/2016   Chest wall pain 01/28/2016   Elevated BP without diagnosis of hypertension 01/28/2016   Abdominal pain, chronic, epigastric 01/31/2014   Hyperglycemia 02/02/2013   Dyslipidemia 08/04/2012   Arm pain, left 06/28/2011   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 No pertinent surgical history.     Home Medications    Prior to Admission medications   Medication Sig Start Date End Date Taking? Authorizing Provider  predniSONE (DELTASONE) 20 MG tablet Take 1 tablet (20 mg total) by mouth daily for 3 days. 09/24/21 09/27/21 Yes Hebert Dooling, Britta Mccreedy, MD  albuterol (VENTOLIN HFA) 108 (90 Base) MCG/ACT inhaler Inhale 1-2 puffs into the lungs every 4 (four) hours as needed for wheezing or shortness of breath. 01/01/21   [name], [name], MD  atorvastatin (LIPITOR) 10 MG tablet Take 1 tablet (10 mg total) by mouth daily. Annual appt is due must see provider for future refills 01/01/21   [name], [name], MD  Cholecalciferol (VITAMIN D3) 1000 UNITS CAPS Take 1 capsule (1,000 Units total) by mouth daily. 03/01/11   [name], [name], MD  Diclofenac Sodium (PENNSAID) 2 % SOLN Place 2 application onto the skin 2 (two) times daily. 08/30/16   Judi Saa, DO  loratadine (CLARITIN)  10 MG tablet Take 1 tablet (10 mg total) by mouth daily. 08/02/12   [name], [name], MD  LORazepam (ATIVAN) 0.5 MG tablet TAKE 1 TABLET BY MOUTH TWICE A DAY AS NEEDED FOR ANXIETY 09/16/21   [name], [name], MD  olmesartan (BENICAR) 40 MG tablet Take 1 tablet (40 mg total) by mouth daily. 07/07/21   [name], [name], MD  pantoprazole (PROTONIX) 40 MG tablet Take 1 tablet (40 mg total) by mouth daily. 01/01/21   [name], [name], MD  scopolamine (TRANSDERM-SCOP) 1 MG/3DAYS Place 1 patch (1.5 mg total) onto the skin every 3  (three) days. 05/26/21   [name], [name], MD  traMADol (ULTRAM) 50 MG tablet TAKE 1 TABLET BY MOUTH THREE TIMES A DAY AS NEEDED 07/29/21   [name], [name], MD    Family History Family History  Problem Relation Age of Onset   Cancer Father        lymphoma    Social History Social History   Tobacco Use   Smoking status: Former   Smokeless tobacco: Never  Substance Use Topics   Alcohol use: No   Drug use: No     Allergies   Aspirin and Norvasc [amlodipine]   Review of Systems Review of Systems  Respiratory: Negative.    Gastrointestinal: Negative.   Skin: Negative.   Neurological: Negative.      Physical Exam Triage Vital Signs ED Triage Vitals  Enc Vitals Group     BP 09/24/21 2000 (!) 149/84     Pulse Rate 09/24/21 2000 61     Resp 09/24/21 2000 19     Temp 09/24/21 2000 98.5 F (36.9 C)     Temp Source 09/24/21 2000 Oral     SpO2 09/24/21 2000 95 %     Weight --      Height --      Head Circumference --      Peak Flow --      Pain Score 09/24/21 1958 2     Pain Loc --      Pain Edu? --      Excl. in GC? --    No data found.  Updated Vital Signs BP (!) 149/84 (BP Location: Left Arm)   Pulse 61   Temp 98.5 F (36.9 C) (Oral)   Resp 19   SpO2 95%   Visual Acuity Right Eye Distance:   Left Eye Distance:   Bilateral Distance:    Right Eye Near:   Left Eye Near:    Bilateral Near:     Physical Exam Vitals and nursing note reviewed.  Constitutional:      General: He is not in acute distress.    Appearance: He is not ill-appearing.  HENT:     Right Ear: Tympanic membrane normal.     Left Ear: Tympanic membrane normal.     Mouth/Throat:     Mouth: Mucous membranes are moist.     Comments: No tongue swelling or lip swelling.  Patient had right-sided visual swelling worse on the right cheek and right jaw.  No significanterythema Cardiovascular:     Rate and Rhythm: Normal rate and regular rhythm.     Pulses: Normal pulses.      Heart sounds: Normal heart sounds.  Pulmonary:     Effort: Pulmonary effort is normal.     Breath sounds: Normal breath sounds.  Neurological:     Mental Status: He is alert.      UC Treatments /  Results  Labs (all labs ordered are listed, but only abnormal results are displayed) Labs Reviewed - No data to display  EKG   Radiology No results found.  Procedures Procedures (including critical care time)  Medications Ordered in UC Medications - No data to display  Initial Impression / Assessment and Plan / UC Course  I have reviewed the triage vital signs and the nursing notes.  Pertinent labs & imaging results that were available during my care of the patient were reviewed by me and considered in my medical decision making (see chart for details).     1.  Bee sting reaction: Prednisone 20 mg orally daily for 3 days Cool compresses of the right side of the face Tylenol as needed for pain and/or fever. Note all lip swelling.  No wheezing noted on exam.  No complaints of chest tightness or wheezing. Final Clinical Impressions(s) / UC Diagnoses   Final diagnoses:  Bee sting reaction, accidental or unintentional, initial encounter     Discharge Instructions      Please medications as prescribed Cold compress over the swollen area will help reduce the swelling Return to urgent care if symptoms worsen    ED Prescriptions     Medication Sig Dispense Auth. Provider   predniSONE (DELTASONE) 20 MG tablet Take 1 tablet (20 mg total) by mouth daily for 3 days. 3 tablet Neldon Shepard, Britta Mccreedy, MD      PDMP not reviewed this encounter.   Merrilee Jansky, MD 09/26/21 1700

## 2021-09-30 ENCOUNTER — Telehealth: Payer: Self-pay | Admitting: Internal Medicine

## 2021-09-30 MED ORDER — LORAZEPAM 0.5 MG PO TABS: ORAL_TABLET | ORAL | 1 refills | Status: DC

## 2021-09-30 NOTE — Telephone Encounter (Signed)
Pt states [facility] on [address] is out of stock of Lorazepam (ATIVAN) 0.5 mg tablet. He would like rx sent to  [id number] [place], [place] - [address] AT [place] [address] Phone:  [phone]  Fax:  [phone]

## 2021-09-30 NOTE — Telephone Encounter (Signed)
Ok Thx 

## 2021-12-29 ENCOUNTER — Other Ambulatory Visit: Payer: Self-pay | Admitting: Internal Medicine

## 2022-01-18 ENCOUNTER — Ambulatory Visit (INDEPENDENT_AMBULATORY_CARE_PROVIDER_SITE_OTHER): Payer: BC Managed Care – PPO | Admitting: Internal Medicine

## 2022-01-18 ENCOUNTER — Encounter: Payer: Self-pay | Admitting: Internal Medicine

## 2022-01-18 VITALS — BP 134/80 | HR 73 | Temp 98.1°F | Ht 71.0 in | Wt 238.4 lb

## 2022-01-18 DIAGNOSIS — Z125 Encounter for screening for malignant neoplasm of prostate: Secondary | ICD-10-CM | POA: Diagnosis not present

## 2022-01-18 DIAGNOSIS — R600 Localized edema: Secondary | ICD-10-CM

## 2022-01-18 DIAGNOSIS — Z0000 Encounter for general adult medical examination without abnormal findings: Secondary | ICD-10-CM | POA: Diagnosis not present

## 2022-01-18 DIAGNOSIS — R609 Edema, unspecified: Secondary | ICD-10-CM | POA: Insufficient documentation

## 2022-01-18 DIAGNOSIS — Z136 Encounter for screening for cardiovascular disorders: Secondary | ICD-10-CM

## 2022-01-18 LAB — COMPREHENSIVE METABOLIC PANEL
ALT: 23 U/L (ref 0–53)
AST: 17 U/L (ref 0–37)
Albumin: 4.5 g/dL (ref 3.5–5.2)
Alkaline Phosphatase: 45 U/L (ref 39–117)
BUN: 13 mg/dL (ref 6–23)
CO2: 28 mEq/L (ref 19–32)
Calcium: 9 mg/dL (ref 8.4–10.5)
Chloride: 105 mEq/L (ref 96–112)
Creatinine, Ser: 1.35 mg/dL (ref 0.40–1.50)
GFR: 53.8 mL/min — ABNORMAL LOW (ref >60.00)
Glucose, Bld: 107 mg/dL — ABNORMAL HIGH (ref 70–99)
Potassium: 4.3 mEq/L (ref 3.5–5.1)
Sodium: 139 mEq/L (ref 135–145)
Total Bilirubin: 0.5 mg/dL (ref 0.2–1.2)
Total Protein: 7.1 g/dL (ref 6.0–8.3)

## 2022-01-18 LAB — CBC WITH DIFFERENTIAL/PLATELET
Basophils Absolute: 0 10*3/uL (ref 0.0–0.1)
Basophils Relative: 0.6 % (ref 0.0–3.0)
Eosinophils Absolute: 0.1 10*3/uL (ref 0.0–0.7)
Eosinophils Relative: 2.1 % (ref 0.0–5.0)
HCT: 43 % (ref 39.0–52.0)
Hemoglobin: 14.2 g/dL (ref 13.0–17.0)
Lymphocytes Relative: 30.8 % (ref 12.0–46.0)
Lymphs Abs: 1.3 10*3/uL (ref 0.7–4.0)
MCHC: 33.1 g/dL (ref 30.0–36.0)
MCV: 92.7 fl (ref 78.0–100.0)
Monocytes Absolute: 0.4 10*3/uL (ref 0.1–1.0)
Monocytes Relative: 10 % (ref 3.0–12.0)
Neutro Abs: 2.5 10*3/uL (ref 1.4–7.7)
Neutrophils Relative %: 56.5 % (ref 43.0–77.0)
Platelets: 169 10*3/uL (ref 150.0–400.0)
RBC: 4.64 Mil/uL (ref 4.22–5.81)
RDW: 13.4 % (ref 11.5–15.5)
WBC: 4.4 10*3/uL (ref 4.0–10.5)

## 2022-01-18 LAB — LIPID PANEL
Cholesterol: 123 mg/dL (ref 0–200)
HDL: 40 mg/dL (ref >39.00)
LDL Cholesterol: 58 mg/dL (ref 0–99)
NonHDL: 83.19
Total CHOL/HDL Ratio: 3
Triglycerides: 124 mg/dL (ref 0.0–149.0)
VLDL: 24.8 mg/dL (ref 0.0–40.0)

## 2022-01-18 LAB — URINALYSIS
Bilirubin Urine: NEGATIVE
Hgb urine dipstick: NEGATIVE
Ketones, ur: NEGATIVE
Leukocytes,Ua: NEGATIVE
Nitrite: NEGATIVE
Specific Gravity, Urine: 1.01 (ref 1.000–1.030)
Total Protein, Urine: NEGATIVE
Urine Glucose: NEGATIVE
Urobilinogen, UA: 0.2 (ref 0.0–1.0)
pH: 6 (ref 5.0–8.0)

## 2022-01-18 LAB — PSA: PSA: 0.15 ng/mL (ref 0.10–4.00)

## 2022-01-18 LAB — TSH: TSH: 1.24 u[IU]/mL (ref 0.35–5.50)

## 2022-01-18 MED ORDER — TRAMADOL HCL 50 MG PO TABS: ORAL_TABLET | ORAL | 1 refills | Status: DC

## 2022-01-18 MED ORDER — SCOPOLAMINE 1 MG/3DAYS TD PT72: 1.0000 | MEDICATED_PATCH | TRANSDERMAL | 1 refills | Status: DC

## 2022-01-18 MED ORDER — LORAZEPAM 0.5 MG PO TABS: ORAL_TABLET | ORAL | 1 refills | Status: DC

## 2022-01-18 MED ORDER — OLMESARTAN MEDOXOMIL 40 MG PO TABS: 40.0000 mg | ORAL_TABLET | Freq: Every day | ORAL | 3 refills | Status: DC

## 2022-01-18 MED ORDER — MOLNUPIRAVIR EUA 200MG CAPSULE: 4.0000 | ORAL_CAPSULE | Freq: Two times a day (BID) | ORAL | 0 refills | Status: AC

## 2022-01-18 MED ORDER — FUROSEMIDE 20 MG PO TABS: 20.0000 mg | ORAL_TABLET | Freq: Every day | ORAL | 1 refills | Status: DC | PRN

## 2022-01-18 MED ORDER — PANTOPRAZOLE SODIUM 40 MG PO TBEC: 40.0000 mg | DELAYED_RELEASE_TABLET | Freq: Every day | ORAL | 3 refills | Status: DC

## 2022-01-18 MED ORDER — ALBUTEROL SULFATE HFA 108 (90 BASE) MCG/ACT IN AERS: 1.0000 | INHALATION_SPRAY | RESPIRATORY_TRACT | 3 refills | Status: DC | PRN

## 2022-01-18 MED ORDER — ATORVASTATIN CALCIUM 10 MG PO TABS: 10.0000 mg | ORAL_TABLET | Freq: Every day | ORAL | 3 refills | Status: DC

## 2022-01-18 NOTE — Assessment & Plan Note (Addendum)
We discussed age appropriate health related issues, including available/recomended screening tests and vaccinations. We discussed a need for adhering to healthy diet and exercise. Labs were ordered to be later reviewed . All questions were answered. Prevnar in 1 mo  Pt opted for Cologuard 2021, due in 2024 Coronary calcium CT offered 10/20. Coronary CT calcium score of 83 - 2021

## 2022-01-18 NOTE — Assessment & Plan Note (Signed)
Due to high sodium intake Furosemide prn (rare)

## 2022-01-18 NOTE — Progress Notes (Signed)
Subjective:  [name], male    DOB: 03-26-1955  Age: 66 y.o. MRN: [id number]  CC: Annual Exam   HPI Kenneth [name] presents for a well exam C/o leg swelling after the 16 d cruise in Oct 2023 - [place]. Resolved  Outpatient Medications Prior to Visit  Medication Sig Dispense Refill   Cholecalciferol (VITAMIN D3) 1000 UNITS CAPS Take 1 capsule (1,000 Units total) by mouth daily. 100 capsule 3   Diclofenac Sodium (PENNSAID) 2 % SOLN Place 2 application onto the skin 2 (two) times daily. 112 g 3   loratadine (CLARITIN) 10 MG tablet Take 1 tablet (10 mg total) by mouth daily. 90 tablet 5   albuterol (VENTOLIN HFA) 108 (90 Base) MCG/ACT inhaler Inhale 1-2 puffs into the lungs every 4 (four) hours as needed for wheezing or shortness of breath. 18 g 3   atorvastatin (LIPITOR) 10 MG tablet TAKE 1 TABLET (10 MG TOTAL) BY MOUTH DAILY. ANNUAL APPT IS DUE MUST SEE PROVIDER FOR FUTURE REFILLS 30 tablet 0   LORazepam (ATIVAN) 0.5 MG tablet TAKE 1 TABLET BY MOUTH TWICE A DAY AS NEEDED FOR ANXIETY 180 tablet 1   olmesartan (BENICAR) 40 MG tablet Take 1 tablet (40 mg total) by mouth daily. 90 tablet 3   pantoprazole (PROTONIX) 40 MG tablet Take 1 tablet (40 mg total) by mouth daily. 90 tablet 3   traMADol (ULTRAM) 50 MG tablet TAKE 1 TABLET BY MOUTH THREE TIMES A DAY AS NEEDED 100 tablet 1   scopolamine (TRANSDERM-SCOP) 1 MG/3DAYS Place 1 patch (1.5 mg total) onto the skin every 3 (three) days. (Patient not taking: Reported on 01/18/2022) 4 patch 1   No facility-administered medications prior to visit.    ROS: Review of Systems  Constitutional:  Negative for appetite change, fatigue and unexpected weight change.  HENT:  Negative for congestion, nosebleeds, sneezing, sore throat and trouble swallowing.   Eyes:  Negative for itching and visual disturbance.  Respiratory:  Negative for cough.   Cardiovascular:  Negative for chest pain, palpitations and leg swelling.  Gastrointestinal:   Negative for abdominal distention, blood in stool, diarrhea and nausea.  Genitourinary:  Negative for frequency and hematuria.  Musculoskeletal:  Negative for back pain, gait problem, joint swelling and neck pain.  Skin:  Negative for rash.  Neurological:  Negative for dizziness, tremors, speech difficulty and weakness.  Psychiatric/Behavioral:  Negative for agitation, dysphoric mood and sleep disturbance. The patient is not nervous/anxious.     Objective:  BP 134/80 (BP Location: Left Arm)   Pulse 73   Temp 98.1 F (36.7 C) (Oral)   Ht 5\' 11"  (1.803 m)   Wt 238 lb 6.4 oz (108.1 kg)   SpO2 97%   BMI 33.25 kg/m   BP Readings from Last 3 Encounters:  01/18/22 134/80  09/24/21 (!) 149/84  07/07/21 126/78    Wt Readings from Last 3 Encounters:  01/18/22 238 lb 6.4 oz (108.1 kg)  07/07/21 232 lb 3.2 oz (105.3 kg)  04/08/21 229 lb 2 oz (103.9 kg)    Physical Exam Constitutional:      General: He is not in acute distress.    Appearance: He is well-developed.     Comments: NAD  Eyes:     Conjunctiva/sclera: Conjunctivae normal.     Pupils: Pupils are equal, round, and reactive to light.  Neck:     Thyroid: No thyromegaly.     Vascular: No JVD.  Cardiovascular:  Rate and Rhythm: Normal rate and regular rhythm.     Heart sounds: Normal heart sounds. No murmur heard.    No friction rub. No gallop.  Pulmonary:     Effort: Pulmonary effort is normal. No respiratory distress.     Breath sounds: Normal breath sounds. No wheezing or rales.  Chest:     Chest wall: No tenderness.  Abdominal:     General: Bowel sounds are normal. There is no distension.     Palpations: Abdomen is soft. There is no mass.     Tenderness: There is no abdominal tenderness. There is no guarding or rebound.  Musculoskeletal:        General: No tenderness. Normal range of motion.     Cervical back: Normal range of motion.  Lymphadenopathy:     Cervical: No cervical adenopathy.  Skin:    General:  Skin is warm and dry.     Findings: No rash.  Neurological:     Mental Status: He is alert and oriented to person, place, and time.     Cranial Nerves: No cranial nerve deficit.     Motor: No abnormal muscle tone.     Coordination: Coordination normal.     Gait: Gait normal.     Deep Tendon Reflexes: Reflexes are normal and symmetric.  Psychiatric:        Behavior: Behavior normal.        Thought Content: Thought content normal.        Judgment: Judgment normal.     Lab Results  Component Value Date   WBC 4.1 01/01/2021   HGB 14.6 01/01/2021   HCT 44.1 01/01/2021   PLT 167.0 01/01/2021   GLUCOSE 97 01/01/2021   CHOL 109 01/01/2021   TRIG 81.0 01/01/2021   HDL 39.60 01/01/2021   LDLDIRECT 68.0 12/25/2019   LDLCALC 53 01/01/2021   ALT 19 01/01/2021   AST 17 01/01/2021   NA 141 01/01/2021   K 4.4 01/01/2021   CL 105 01/01/2021   CREATININE 1.40 01/01/2021   BUN 12 01/01/2021   CO2 29 01/01/2021   TSH 1.00 01/01/2021   PSA 0.16 01/01/2021   HGBA1C 6.1 04/09/2013    No results found.  Assessment & Plan:   Problem List Items Addressed This Visit     Edema    Due to high sodium intake Furosemide prn (rare)      Well adult exam - Primary    We discussed age appropriate health related issues, including available/recomended screening tests and vaccinations. We discussed a need for adhering to healthy diet and exercise. Labs were ordered to be later reviewed . All questions were answered. Prevnar in 1 mo  Pt opted for Cologuard Coronary calcium CT offered 10/20. Coronary CT calcium score of 83 - 2021      Relevant Orders   TSH   Urinalysis   CBC with Differential/Platelet   Lipid panel   PSA   Comprehensive metabolic panel      Meds ordered this encounter  Medications   furosemide (LASIX) 20 MG tablet    Sig: Take 1 tablet (20 mg total) by mouth daily as needed.    Dispense:  30 tablet    Refill:  1   molnupiravir EUA (LAGEVRIO) 200 mg CAPS capsule     Sig: Take 4 capsules (800 mg total) by mouth 2 (two) times daily for 5 days.    Dispense:  40 capsule    Refill:  0  albuterol (VENTOLIN HFA) 108 (90 Base) MCG/ACT inhaler    Sig: Inhale 1-2 puffs into the lungs every 4 (four) hours as needed for wheezing or shortness of breath.    Dispense:  18 g    Refill:  3   atorvastatin (LIPITOR) 10 MG tablet    Sig: Take 1 tablet (10 mg total) by mouth daily. Annual appt is due must see provider for future refills    Dispense:  90 tablet    Refill:  3   LORazepam (ATIVAN) 0.5 MG tablet    Sig: TAKE 1 TABLET BY MOUTH TWICE A DAY AS NEEDED FOR ANXIETY    Dispense:  180 tablet    Refill:  1    This request is for a new prescription for a controlled substance as required by Federal/State law.   olmesartan (BENICAR) 40 MG tablet    Sig: Take 1 tablet (40 mg total) by mouth daily.    Dispense:  90 tablet    Refill:  3   pantoprazole (PROTONIX) 40 MG tablet    Sig: Take 1 tablet (40 mg total) by mouth daily.    Dispense:  90 tablet    Refill:  3   scopolamine (TRANSDERM-SCOP) 1 MG/3DAYS    Sig: Place 1 patch (1.5 mg total) onto the skin every 3 (three) days.    Dispense:  4 patch    Refill:  1   traMADol (ULTRAM) 50 MG tablet    Sig: TAKE 1 TABLET BY MOUTH THREE TIMES A DAY AS NEEDED    Dispense:  100 tablet    Refill:  1    This request is for a new prescription for a controlled substance as required by Federal/State law.      Follow-up: Return in about 6 months (around 07/20/2022) for a follow-up visit.  Sonda Primes, MD

## 2022-01-20 ENCOUNTER — Encounter: Payer: Self-pay | Admitting: Internal Medicine

## 2022-01-22 ENCOUNTER — Other Ambulatory Visit: Payer: Self-pay | Admitting: Internal Medicine

## 2022-01-22 MED ORDER — BUDESONIDE-FORMOTEROL FUMARATE 80-4.5 MCG/ACT IN AERO: 2.0000 | INHALATION_SPRAY | Freq: Two times a day (BID) | RESPIRATORY_TRACT | 5 refills | Status: DC

## 2022-02-12 ENCOUNTER — Other Ambulatory Visit: Payer: Self-pay | Admitting: Internal Medicine

## 2022-05-27 ENCOUNTER — Encounter: Payer: Self-pay | Admitting: Internal Medicine

## 2022-05-27 ENCOUNTER — Ambulatory Visit: Payer: BC Managed Care – PPO | Admitting: Internal Medicine

## 2022-05-27 VITALS — BP 110/78 | HR 58 | Temp 97.9°F | Ht 71.0 in | Wt 233.0 lb

## 2022-05-27 DIAGNOSIS — H9313 Tinnitus, bilateral: Secondary | ICD-10-CM | POA: Diagnosis not present

## 2022-05-27 DIAGNOSIS — H6123 Impacted cerumen, bilateral: Secondary | ICD-10-CM

## 2022-05-27 DIAGNOSIS — E785 Hyperlipidemia, unspecified: Secondary | ICD-10-CM

## 2022-05-27 DIAGNOSIS — H6120 Impacted cerumen, unspecified ear: Secondary | ICD-10-CM | POA: Insufficient documentation

## 2022-05-27 DIAGNOSIS — F411 Generalized anxiety disorder: Secondary | ICD-10-CM | POA: Diagnosis not present

## 2022-05-27 MED ORDER — SCOPOLAMINE 1 MG/3DAYS TD PT72: 1.0000 | MEDICATED_PATCH | TRANSDERMAL | 1 refills | Status: DC

## 2022-05-27 MED ORDER — ALBUTEROL SULFATE HFA 108 (90 BASE) MCG/ACT IN AERS: 1.0000 | INHALATION_SPRAY | RESPIRATORY_TRACT | 3 refills | Status: DC | PRN

## 2022-05-27 MED ORDER — OLMESARTAN MEDOXOMIL 40 MG PO TABS: 40.0000 mg | ORAL_TABLET | Freq: Every day | ORAL | 3 refills | Status: DC

## 2022-05-27 MED ORDER — FUROSEMIDE 20 MG PO TABS: 20.0000 mg | ORAL_TABLET | Freq: Every day | ORAL | 1 refills | Status: DC | PRN

## 2022-05-27 MED ORDER — ATORVASTATIN CALCIUM 10 MG PO TABS: 10.0000 mg | ORAL_TABLET | Freq: Every day | ORAL | 3 refills | Status: DC

## 2022-05-27 MED ORDER — TRAMADOL HCL 50 MG PO TABS: ORAL_TABLET | ORAL | 1 refills | Status: DC

## 2022-05-27 MED ORDER — VITAMIN D3 25 MCG (1000 UT) PO CAPS: 1000.0000 [IU] | ORAL_CAPSULE | Freq: Every day | ORAL | 3 refills | Status: DC

## 2022-05-27 MED ORDER — LORAZEPAM 0.5 MG PO TABS: ORAL_TABLET | ORAL | 1 refills | Status: DC

## 2022-05-27 MED ORDER — LORATADINE 10 MG PO TABS: 10.0000 mg | ORAL_TABLET | Freq: Every day | ORAL | 5 refills | Status: DC

## 2022-05-27 MED ORDER — PANTOPRAZOLE SODIUM 40 MG PO TBEC: 40.0000 mg | DELAYED_RELEASE_TABLET | Freq: Every day | ORAL | 3 refills | Status: DC

## 2022-05-27 MED ORDER — BUDESONIDE-FORMOTEROL FUMARATE 80-4.5 MCG/ACT IN AERO: 2.0000 | INHALATION_SPRAY | Freq: Two times a day (BID) | RESPIRATORY_TRACT | 5 refills | Status: DC

## 2022-05-27 NOTE — Progress Notes (Signed)
Subjective:  [name], male    DOB: Jul 08, 1955  Age: 67 y.o. MRN: [id number]  CC: Dizziness (OFF AND ON episodes of dizziness and light head, rt shoulder pain causing shooting pain across chest)   HPI Kenneth [name] presents for tinnitus. The pt has been to a specialist at [facility]. C/o dizziness too.  Per ENT note: "Kenneth [name] is a 67 y.o. male was referred by Tresa Garter, MD for evaluation and bilateral tinnitus.  He reports bilateral tinnitus for at least the last 10 years, non pulsatile. He reports at times the tinnitus can become so severe that it makes him nauseous and at times he can be a little light headed. He denies vertigo. He takes 0.25mg  of lorazepam which is helpful. Reports dizziness (described as light headedness) on norvasc which was stopped and he started olmesartan in late Februrary which has greatly improved his light headedness. Typically has 2 cups of caffeine per day.  He reports having to turn in up the volume on the TV due to difficulty in discerning words, but he doesn't notice problems hearing in noise. The patient denies aural fullness, otalgia, otorrhea, vertigo, nausea, autophony. No prior history of otologic surgery, recurrent ear infections, or hearing aid use. Noise exposure: concerts. 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 Family history of presbycusis  The history was collected from: the patient, review of medical records.  Past Medical History: Diagnosis Date Asthma, unspecified asthma severity, unspecified whether complicated, unspecified whether persistent occasionally GERD (gastroesophageal reflux disease) occasinally Hypertension few months ago  History reviewed. No pertinent surgical history. Current Outpatient Medications on File Prior to Visit Medication Sig Dispense Refill albuterol 90  mcg/actuation inhaler INHALE 1-2 PUFFS INTO THE LUNGS EVERY 4 HOURS AS NEEDED FOR WHEEZING OR SHORTNESS OF BREATH. atorvastatin (LIPITOR) 10 MG tablet Take by mouth loratadine (CLARITIN) 10 mg tablet Take 10 mg by mouth once daily LORazepam (ATIVAN) 0.5 MG tablet Take 0.5 mg by mouth 2 (two) times daily as needed olmesartan (BENICAR) 20 MG tablet Take 20 mg by mouth once daily pantoprazole (PROTONIX) 40 MG DR tablet Take 40 mg by mouth once daily scopolamine (TRANSDERM-SCOP 1.5 MG) 1 mg over 3 days patch Place onto the skin traMADoL (ULTRAM) 50 mg tablet Take 50 mg by mouth 3 (three) times daily as needed  No current facility-administered medications on file prior to visit.  Social History  Socioeconomic History Marital status: Married Tobacco Use Smoking status: Never Smokeless tobacco: Never Substance and Sexual Activity Alcohol use: Yes Alcohol/week: 3.0 standard drinks Types: 3 Glasses of wine per week Comment: occasionally Drug use: Never Sexual activity: Yes Partners: Female Birth control/protection: None  Family History Problem Relation Age of Onset Hearing loss Mother  Review of Systems [name], [place] 03/27/4707 62:83 AM Sign when Signing Visit Review of Systems Constitutional: Negative for chills and fever. HENT: Positive for tinnitus. Negative for ear pain and sore throat. Eyes: Negative for pain and visual disturbance. Respiratory: Negative for cough and shortness of breath. Cardiovascular: Negative for chest pain and palpitations. Gastrointestinal: Negative for abdominal pain and vomiting. Genitourinary: Negative for dysuria and hematuria. Musculoskeletal: Negative for arthralgias and back pain. Skin: Negative for color change and rash. Neurological: Positive for dizziness. Negative for seizures and syncope. All other systems reviewed and are negative.     A complete review of systems was documented in the nursing  note and was reviewed by  me.  Objective: BP 129/72 (BP Location: Right upper arm, Patient Position: Sitting, BP Cuff Size: Large Adult)  Pulse 53  Temp 36.3 C (97.3 F) (Temporal)  Ht 180.3 cm (5\' 11" )  Wt (!) 105.2 kg (232 lb)  BMI 32.36 kg/m  The patient heard the examiner in a quiet exam room without difficulty and could communicate well in [place].  ENT: There was no sinus tenderness. Salivary gland contours normal. Binocular microscopy was performed. The external auditory canals were clear bilaterally. The tympanic membranes had normal landmarks and were in good position; the middle ears appeared well aerated.  Weber tuning fork exam at 512 Hz was heard midline. Air conduction was heard greater than bone conduction on Rinne tuning fork exam bilaterally.  The nares, oral cavity and oropharynx were normal  NEURO: On neurological examination, the patient was awake, alert, and fully oriented. Cranial nerves II through XII were intact.  EYES: PERRL, EOMI, conjunctiva clear. SKIN: Scalp, facial and cervical skin were normal on examination. Skin was warm and dry. MSK: The neck had full range of motion, midline trachea, and no thyromegaly. RESP: Respiratory excursions were even and unlabored. CARDIO: There was no peripheral edema. GI: There was no cervical or supraclavicular adenopathy. PSYCH: Mood and affect were normal.  I have personally reviewed the following tests and reported results:  Audiogram: n/a  Vestibular testing: none  Imaging: none  Labs: n/a   Assessment:  It is my impression that Kenneth [name] has bilateral non-pulsatile tinnitus. Review of outside records indicate a mild sensorineural hearing loss bilaterally although audiogram is not visible. We discussed the pathophysiology of tinnitus and management option including doing nothing, CBT, avoidance of stress and caffeine, "white noise" to mask, hearing amplification and/or tinnitus masking. He is not particularly disturbed by the  tinnitus and has good coping techniques, but would like to improve tinnitus if possible. He is interested in tinnitus masking.  Regarding history of dizziness, described as light headedness, this has resolved with change in blood pressure medications and he has no indication of inner ear pathology.  Plan:  - Recommend hearing amplification and/or tinnitus masking - Consider reducing caffeine intake - Should tinnitus become bothersome to him, may consider CBT  Return 1 year with audiogram   I spent a total of 45 minutes in both face-to-face and non-face-to-face activities, excluding procedures performed, for this visit on the date of this encounter.  Electronically signed by Larena Sox, MD at 07/15/2021 8:12 AM EDT "   Outpatient Medications Prior to Visit  Medication Sig Dispense Refill   Diclofenac Sodium (PENNSAID) 2 % SOLN Place 2 application onto the skin 2 (two) times daily. 112 g 3   albuterol (VENTOLIN HFA) 108 (90 Base) MCG/ACT inhaler Inhale 1-2 puffs into the lungs every 4 (four) hours as needed for wheezing or shortness of breath. 18 g 3   atorvastatin (LIPITOR) 10 MG tablet Take 1 tablet (10 mg total) by mouth daily. Annual appt is due must see provider for future refills 90 tablet 3   budesonide-formoterol (SYMBICORT) 80-4.5 MCG/ACT inhaler Inhale 2 puffs into the lungs 2 (two) times daily. 1 each 5   Cholecalciferol (VITAMIN D3) 1000 UNITS CAPS Take 1 capsule (1,000 Units total) by mouth daily. 100 capsule 3   furosemide (LASIX) 20 MG tablet TAKE 1 TABLET BY MOUTH EVERY DAY AS NEEDED 90 tablet 1   loratadine (CLARITIN) 10 MG tablet Take 1 tablet (10 mg total) by mouth  daily. 90 tablet 5   LORazepam (ATIVAN) 0.5 MG tablet TAKE 1 TABLET BY MOUTH TWICE A DAY AS NEEDED FOR ANXIETY 180 tablet 1   olmesartan (BENICAR) 40 MG tablet Take 1 tablet (40 mg total) by mouth daily. 90 tablet 3   pantoprazole (PROTONIX) 40 MG tablet Take 1 tablet (40 mg total) by mouth daily. 90  tablet 3   scopolamine (TRANSDERM-SCOP) 1 MG/3DAYS Place 1 patch (1.5 mg total) onto the skin every 3 (three) days. 4 patch 1   traMADol (ULTRAM) 50 MG tablet TAKE 1 TABLET BY MOUTH THREE TIMES A DAY AS NEEDED 100 tablet 1   No facility-administered medications prior to visit.    ROS: Review of Systems  Constitutional:  Negative for appetite change, fatigue and unexpected weight change.  HENT:  Positive for hearing loss and tinnitus. Negative for congestion, nosebleeds, sneezing, sore throat and trouble swallowing.   Eyes:  Negative for itching and visual disturbance.  Respiratory:  Negative for cough.   Cardiovascular:  Negative for chest pain, palpitations and leg swelling.  Gastrointestinal:  Negative for abdominal distention, blood in stool, diarrhea and nausea.  Genitourinary:  Negative for frequency and hematuria.  Musculoskeletal:  Negative for back pain, gait problem, joint swelling and neck pain.  Skin:  Negative for rash.  Neurological:  Positive for dizziness. Negative for tremors, speech difficulty and weakness.  Psychiatric/Behavioral:  Negative for agitation, dysphoric mood, sleep disturbance and suicidal ideas. The patient is not nervous/anxious.     Objective:  BP 110/78 (BP Location: Left Arm, Patient Position: Sitting, Cuff Size: Large)   Pulse (!) 58   Temp 97.9 F (36.6 C) (Oral)   Ht 5\' 11"  (1.803 m)   Wt 233 lb (105.7 kg)   SpO2 93%   BMI 32.50 kg/m   BP Readings from Last 3 Encounters:  05/27/22 110/78  01/18/22 134/80  09/24/21 (!) 149/84    Wt Readings from Last 3 Encounters:  05/27/22 233 lb (105.7 kg)  01/18/22 238 lb 6.4 oz (108.1 kg)  07/07/21 232 lb 3.2 oz (105.3 kg)    Physical Exam Constitutional:      General: He is not in acute distress.    Appearance: He is well-developed. He is obese.     Comments: NAD  Eyes:     Conjunctiva/sclera: Conjunctivae normal.     Pupils: Pupils are equal, round, and reactive to light.  Neck:      Thyroid: No thyromegaly.     Vascular: No JVD.  Cardiovascular:     Rate and Rhythm: Normal rate and regular rhythm.     Heart sounds: Normal heart sounds. No murmur heard.    No friction rub. No gallop.  Pulmonary:     Effort: Pulmonary effort is normal. No respiratory distress.     Breath sounds: Normal breath sounds. No wheezing or rales.  Chest:     Chest wall: No tenderness.  Abdominal:     General: Bowel sounds are normal. There is no distension.     Palpations: Abdomen is soft. There is no mass.     Tenderness: There is no abdominal tenderness. There is no guarding or rebound.  Musculoskeletal:        General: No tenderness. Normal range of motion.     Cervical back: Normal range of motion.  Lymphadenopathy:     Cervical: No cervical adenopathy.  Skin:    General: Skin is warm and dry.     Findings: No rash.  Neurological:  Mental Status: He is alert and oriented to person, place, and time.     Cranial Nerves: No cranial nerve deficit.     Motor: No abnormal muscle tone.     Coordination: Coordination normal.     Gait: Gait normal.     Deep Tendon Reflexes: Reflexes are normal and symmetric.  Psychiatric:        Behavior: Behavior normal.        Thought Content: Thought content normal.        Judgment: Judgment normal.   B wax    Procedure Note :     Procedure :  Ear irrigation right and left ears   Indication:  Cerumen impaction right and left ears   Risks, including pain, dizziness, eardrum perforation, bleeding, infection and others as well as benefits were explained to the patient in detail. Verbal consent was obtained and the patient agreed to proceed.    We used "The Elephant Ear Irrigation Device" filled with lukewarm water for irrigation. A large amount wax was recovered from both ears. Procedure has also required manual wax removal/instrumentation with an ear wax curette and ear forceps on the right and left ears.   Tolerated well. Complications:  None.   Postprocedure instructions :  Call if problems.  Lab Results  Component Value Date   WBC 4.4 01/18/2022   HGB 14.2 01/18/2022   HCT 43.0 01/18/2022   PLT 169.0 01/18/2022   GLUCOSE 107 (H) 01/18/2022   CHOL 123 01/18/2022   TRIG 124.0 01/18/2022   HDL 40.00 01/18/2022   LDLDIRECT 68.0 12/25/2019   LDLCALC 58 01/18/2022   ALT 23 01/18/2022   AST 17 01/18/2022   NA 139 01/18/2022   K 4.3 01/18/2022   CL 105 01/18/2022   CREATININE 1.35 01/18/2022   BUN 13 01/18/2022   CO2 28 01/18/2022   TSH 1.24 01/18/2022   PSA 0.15 01/18/2022   HGBA1C 6.1 04/09/2013    No results found.  Assessment & Plan:   Problem List Items Addressed This Visit     Anxiety state    Chronic  Lorazepam prn  Potential benefits of a long term benzodiazepines  use as well as potential risks  and complications were explained to the patient and were aknowledged.      Relevant Medications   LORazepam (ATIVAN) 0.5 MG tablet   Tinnitus    Chronic and refractory vertigo and tinnitus.  Schedule a follow-up with Dr. Leonia Corona at [facility]      Dyslipidemia    Cont w/Lipitor      Relevant Medications   atorvastatin (LIPITOR) 10 MG tablet   Cerumen impaction - Primary    Options discussed.  Will irrigate both ears.  See procedure         Meds ordered this encounter  Medications   albuterol (VENTOLIN HFA) 108 (90 Base) MCG/ACT inhaler    Sig: Inhale 1-2 puffs into the lungs every 4 (four) hours as needed for wheezing or shortness of breath.    Dispense:  18 g    Refill:  3   atorvastatin (LIPITOR) 10 MG tablet    Sig: Take 1 tablet (10 mg total) by mouth daily. Annual appt is due must see provider for future refills    Dispense:  90 tablet    Refill:  3   budesonide-formoterol (SYMBICORT) 80-4.5 MCG/ACT inhaler    Sig: Inhale 2 puffs into the lungs 2 (two) times daily.    Dispense:  1 each  Refill:  5   Cholecalciferol (VITAMIN D3) 25 MCG (1000 UT) capsule    Sig: Take 1  capsule (1,000 Units total) by mouth daily.    Dispense:  100 capsule    Refill:  3   furosemide (LASIX) 20 MG tablet    Sig: Take 1 tablet (20 mg total) by mouth daily as needed.    Dispense:  90 tablet    Refill:  1   loratadine (CLARITIN) 10 MG tablet    Sig: Take 1 tablet (10 mg total) by mouth daily.    Dispense:  90 tablet    Refill:  5   LORazepam (ATIVAN) 0.5 MG tablet    Sig: TAKE 1 TABLET BY MOUTH TWICE A DAY AS NEEDED FOR ANXIETY    Dispense:  180 tablet    Refill:  1    This request is for a new prescription for a controlled substance as required by Federal/State law.   pantoprazole (PROTONIX) 40 MG tablet    Sig: Take 1 tablet (40 mg total) by mouth daily.    Dispense:  90 tablet    Refill:  3   scopolamine (TRANSDERM-SCOP) 1 MG/3DAYS    Sig: Place 1 patch (1.5 mg total) onto the skin every 3 (three) days.    Dispense:  4 patch    Refill:  1   traMADol (ULTRAM) 50 MG tablet    Sig: TAKE 1 TABLET BY MOUTH THREE TIMES A DAY AS NEEDED    Dispense:  100 tablet    Refill:  1    This request is for a new prescription for a controlled substance as required by Federal/State law.   olmesartan (BENICAR) 40 MG tablet    Sig: Take 1 tablet (40 mg total) by mouth daily.    Dispense:  90 tablet    Refill:  3      Follow-up: Return in about 3 months (around 08/26/2022) for a follow-up visit.  Sonda Primes, MD

## 2022-05-27 NOTE — Patient Instructions (Addendum)
Reduce Olmesartan to 1/2 tab a day or hold it for 2-3 days to see if dizziness would go away

## 2022-05-27 NOTE — Assessment & Plan Note (Addendum)
Options discussed.  Will irrigate both ears.  See procedure

## 2022-06-19 NOTE — Assessment & Plan Note (Signed)
Chronic  Lorazepam prn  Potential benefits of a long term benzodiazepines  use as well as potential risks  and complications were explained to the patient and were aknowledged. 

## 2022-06-19 NOTE — Assessment & Plan Note (Signed)
Chronic and refractory vertigo and tinnitus.  Schedule a follow-up with Dr. Leonia Corona at [facility]

## 2022-06-19 NOTE — Assessment & Plan Note (Signed)
Cont w/Lipitor 

## 2022-06-21 ENCOUNTER — Encounter: Payer: Self-pay | Admitting: Internal Medicine

## 2022-06-23 MED ORDER — OLMESARTAN MEDOXOMIL 40 MG PO TABS: 40.0000 mg | ORAL_TABLET | Freq: Every day | ORAL | 2 refills | Status: DC

## 2022-06-25 ENCOUNTER — Telehealth: Payer: Self-pay | Admitting: Pharmacy Technician

## 2022-06-25 NOTE — Telephone Encounter (Signed)
Patient Advocate Encounter  Prior Authorization for Tramadol has been approved with [employer].    PA# [id number] Effective dates: 06/21/22 through 12/22/22

## 2022-07-06 NOTE — Telephone Encounter (Signed)
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

## 2022-07-14 ENCOUNTER — Encounter: Payer: Self-pay | Admitting: Internal Medicine

## 2022-08-30 ENCOUNTER — Encounter: Payer: Self-pay | Admitting: Internal Medicine

## 2022-09-03 ENCOUNTER — Other Ambulatory Visit: Payer: Self-pay | Admitting: Internal Medicine

## 2022-09-03 MED ORDER — MOLNUPIRAVIR EUA 200MG CAPSULE: 4.0000 | ORAL_CAPSULE | Freq: Two times a day (BID) | ORAL | 0 refills | Status: AC

## 2022-09-15 ENCOUNTER — Other Ambulatory Visit: Payer: Self-pay | Admitting: Internal Medicine

## 2022-09-15 MED ORDER — NIRMATRELVIR/RITONAVIR (PAXLOVID) TABLET (RENAL DOSING): 2.0000 | ORAL_TABLET | Freq: Two times a day (BID) | ORAL | 0 refills | Status: DC

## 2022-10-26 ENCOUNTER — Encounter: Payer: Self-pay | Admitting: Internal Medicine

## 2022-10-26 ENCOUNTER — Ambulatory Visit: Payer: BC Managed Care – PPO | Admitting: Internal Medicine

## 2022-10-26 VITALS — BP 118/78 | HR 59 | Temp 98.3°F | Ht 71.0 in | Wt 235.0 lb

## 2022-10-26 DIAGNOSIS — F411 Generalized anxiety disorder: Secondary | ICD-10-CM

## 2022-10-26 DIAGNOSIS — R944 Abnormal results of kidney function studies: Secondary | ICD-10-CM | POA: Diagnosis not present

## 2022-10-26 DIAGNOSIS — E785 Hyperlipidemia, unspecified: Secondary | ICD-10-CM | POA: Diagnosis not present

## 2022-10-26 DIAGNOSIS — I10 Essential (primary) hypertension: Secondary | ICD-10-CM | POA: Diagnosis not present

## 2022-10-26 DIAGNOSIS — M5412 Radiculopathy, cervical region: Secondary | ICD-10-CM

## 2022-10-26 DIAGNOSIS — R739 Hyperglycemia, unspecified: Secondary | ICD-10-CM

## 2022-10-26 DIAGNOSIS — Z8616 Personal history of COVID-19: Secondary | ICD-10-CM

## 2022-10-26 DIAGNOSIS — Z0000 Encounter for general adult medical examination without abnormal findings: Secondary | ICD-10-CM

## 2022-10-26 DIAGNOSIS — H6690 Otitis media, unspecified, unspecified ear: Secondary | ICD-10-CM

## 2022-10-26 MED ORDER — CEFDINIR 300 MG PO CAPS: 300.0000 mg | ORAL_CAPSULE | Freq: Two times a day (BID) | ORAL | 1 refills | Status: DC

## 2022-10-26 NOTE — Assessment & Plan Note (Signed)
Coronary CT calcium score of 83. This was 50 th percentile for age and sex matched control. Cont w/Lipitor

## 2022-10-26 NOTE — Assessment & Plan Note (Signed)
Monitor A1c 

## 2022-10-26 NOTE — Patient Instructions (Signed)
Use Afrin nasal spray x 5 days

## 2022-10-26 NOTE — Assessment & Plan Note (Addendum)
L throat and L ear discomfort x 2 weeks after COVID Omnicef po

## 2022-10-26 NOTE — Assessment & Plan Note (Signed)
09/2022 

## 2022-10-26 NOTE — Assessment & Plan Note (Addendum)
Pain relapsed - L side L throat and L ear discomfort x 2 weeks; L shoulder pain Pain relapsed - L side

## 2022-10-26 NOTE — Assessment & Plan Note (Signed)
Monitor GFR Hydrate well 

## 2022-10-26 NOTE — Progress Notes (Signed)
Subjective:  [name], male    DOB: [birth year]  Age: 67 y.o. MRN: [id number]  CC: Neck Pain (Lt ear and throat discomfort)   HPI Kenneth [name] presents for HTN, dyslipidemia L throat and L ear discomfort x 2 weeks after COVID  L shoulder pain Pain relapsed - L side  C/o neck pain  Outpatient Medications Prior to Visit  Medication Sig Dispense Refill   albuterol (VENTOLIN HFA) 108 (90 Base) MCG/ACT inhaler Inhale 1-2 puffs into the lungs every 4 (four) hours as needed for wheezing or shortness of breath. 18 g 3   atorvastatin (LIPITOR) 10 MG tablet Take 1 tablet (10 mg total) by mouth daily. Annual appt is due must see provider for future refills 90 tablet 3   budesonide-formoterol (SYMBICORT) 80-4.5 MCG/ACT inhaler Inhale 2 puffs into the lungs 2 (two) times daily. 1 each 5   Cholecalciferol (VITAMIN D3) 25 MCG (1000 UT) capsule Take 1 capsule (1,000 Units total) by mouth daily. 100 capsule 3   Diclofenac Sodium (PENNSAID) 2 % SOLN Place 2 application onto the skin 2 (two) times daily. 112 g 3   furosemide (LASIX) 20 MG tablet Take 1 tablet (20 mg total) by mouth daily as needed. 90 tablet 1   loratadine (CLARITIN) 10 MG tablet Take 1 tablet (10 mg total) by mouth daily. 90 tablet 5   LORazepam (ATIVAN) 0.5 MG tablet TAKE 1 TABLET BY MOUTH TWICE A DAY AS NEEDED FOR ANXIETY 180 tablet 1   olmesartan (BENICAR) 40 MG tablet Take 1 tablet (40 mg total) by mouth daily. 90 tablet 2   pantoprazole (PROTONIX) 40 MG tablet Take 1 tablet (40 mg total) by mouth daily. 90 tablet 3   scopolamine (TRANSDERM-SCOP) 1 MG/3DAYS Place 1 patch (1.5 mg total) onto the skin every 3 (three) days. 4 patch 1   traMADol (ULTRAM) 50 MG tablet TAKE 1 TABLET BY MOUTH THREE TIMES A DAY AS NEEDED 100 tablet 1   No facility-administered medications prior to visit.    ROS: Review of Systems  Constitutional:  Negative for appetite change, fatigue and unexpected weight change.  HENT:  Negative for  congestion, nosebleeds, sneezing, sore throat and trouble swallowing.   Eyes:  Negative for itching and visual disturbance.  Respiratory:  Negative for cough.   Cardiovascular:  Negative for chest pain, palpitations and leg swelling.  Gastrointestinal:  Negative for abdominal distention, blood in stool, diarrhea and nausea.  Genitourinary:  Negative for frequency and hematuria.  Musculoskeletal:  Negative for back pain, gait problem, joint swelling and neck pain.  Skin:  Negative for rash.  Neurological:  Negative for dizziness, tremors, speech difficulty and weakness.  Psychiatric/Behavioral:  Negative for agitation, dysphoric mood and sleep disturbance. The patient is not nervous/anxious.     Objective:  BP 118/78 (BP Location: Left Arm, Patient Position: Sitting, Cuff Size: Large)   Pulse (!) 59   Temp 98.3 F (36.8 C) (Oral)   Ht 5\' 11"  (1.803 m)   Wt 235 lb (106.6 kg)   SpO2 96%   BMI 32.78 kg/m   BP Readings from Last 3 Encounters:  10/26/22 118/78  05/27/22 110/78  01/18/22 134/80    Wt Readings from Last 3 Encounters:  10/26/22 235 lb (106.6 kg)  05/27/22 233 lb (105.7 kg)  01/18/22 238 lb 6.4 oz (108.1 kg)    Physical Exam Constitutional:      General: He is not in acute distress.    Appearance: He is well-developed.  Comments: NAD  Eyes:     Conjunctiva/sclera: Conjunctivae normal.     Pupils: Pupils are equal, round, and reactive to light.  Neck:     Thyroid: No thyromegaly.     Vascular: No JVD.  Cardiovascular:     Rate and Rhythm: Normal rate and regular rhythm.     Heart sounds: Normal heart sounds. No murmur heard.    No friction rub. No gallop.  Pulmonary:     Effort: Pulmonary effort is normal. No respiratory distress.     Breath sounds: Normal breath sounds. No wheezing or rales.  Chest:     Chest wall: No tenderness.  Abdominal:     General: Bowel sounds are normal. There is no distension.     Palpations: Abdomen is soft. There is no  mass.     Tenderness: There is no abdominal tenderness. There is no guarding or rebound.  Musculoskeletal:        General: No tenderness. Normal range of motion.     Cervical back: Normal range of motion.  Lymphadenopathy:     Cervical: No cervical adenopathy.  Skin:    General: Skin is warm and dry.     Findings: No rash.  Neurological:     Mental Status: He is alert and oriented to person, place, and time.     Cranial Nerves: No cranial nerve deficit.     Motor: No abnormal muscle tone.     Coordination: Coordination normal.     Gait: Gait normal.     Deep Tendon Reflexes: Reflexes are normal and symmetric.  Psychiatric:        Behavior: Behavior normal.        Thought Content: Thought content normal.        Judgment: Judgment normal.     Lab Results  Component Value Date   WBC 4.4 01/18/2022   HGB 14.2 01/18/2022   HCT 43.0 01/18/2022   PLT 169.0 01/18/2022   GLUCOSE 107 (H) 01/18/2022   CHOL 123 01/18/2022   TRIG 124.0 01/18/2022   HDL 40.00 01/18/2022   LDLDIRECT 68.0 12/25/2019   LDLCALC 58 01/18/2022   ALT 23 01/18/2022   AST 17 01/18/2022   NA 139 01/18/2022   K 4.3 01/18/2022   CL 105 01/18/2022   CREATININE 1.35 01/18/2022   BUN 13 01/18/2022   CO2 28 01/18/2022   TSH 1.24 01/18/2022   PSA 0.15 01/18/2022   HGBA1C 6.1 04/09/2013    No results found.  Assessment & Plan:   Problem List Items Addressed This Visit     Anxiety state    Lorazepam prn  Potential benefits of a long term benzodiazepines  use as well as potential risks  and complications were explained to the patient and were aknowledged.      Well adult exam   Relevant Orders   TSH   Urinalysis   CBC with Differential/Platelet   Lipid panel   PSA   Comprehensive metabolic panel   Dyslipidemia    Coronary CT calcium score of 83. This was 50 th percentile for age and sex matched control. Cont w/Lipitor      Relevant Orders   TSH   Lipid panel   Hyperglycemia    Monitor  A1c      Cervical radiculopathy    Pain relapsed - L side L throat and L ear discomfort x 2 weeks; L shoulder pain Pain relapsed - L side      Decreased GFR -  Primary    Monitor GFR Hydrate well       HTN (hypertension)    C/o dizziness on Norvasc - d/c OnOlmesartan po      Otitis media    L throat and L ear discomfort x 2 weeks after COVID Omnicef po      Relevant Medications   cefdinir (OMNICEF) 300 MG capsule   History of COVID-19    09/2022         Meds ordered this encounter  Medications   cefdinir (OMNICEF) 300 MG capsule    Sig: Take 1 capsule (300 mg total) by mouth 2 (two) times daily.    Dispense:  20 capsule    Refill:  1      Follow-up: Return in about 6 months (around 04/25/2023) for Wellness Exam.  Sonda Primes, MD

## 2022-10-26 NOTE — Assessment & Plan Note (Signed)
C/o dizziness on Norvasc - d/c OnOlmesartan po

## 2022-10-26 NOTE — Assessment & Plan Note (Signed)
Lorazepam prn  Potential benefits of a long term benzodiazepines  use as well as potential risks  and complications were explained to the patient and were aknowledged.  

## 2022-11-19 ENCOUNTER — Other Ambulatory Visit: Payer: Self-pay | Admitting: Internal Medicine

## 2022-11-19 DIAGNOSIS — Z1211 Encounter for screening for malignant neoplasm of colon: Secondary | ICD-10-CM

## 2022-11-19 DIAGNOSIS — Z1212 Encounter for screening for malignant neoplasm of rectum: Secondary | ICD-10-CM

## 2022-12-30 ENCOUNTER — Other Ambulatory Visit (INDEPENDENT_AMBULATORY_CARE_PROVIDER_SITE_OTHER): Payer: BC Managed Care – PPO

## 2022-12-30 DIAGNOSIS — Z0000 Encounter for general adult medical examination without abnormal findings: Secondary | ICD-10-CM | POA: Diagnosis not present

## 2022-12-30 DIAGNOSIS — E785 Hyperlipidemia, unspecified: Secondary | ICD-10-CM | POA: Diagnosis not present

## 2022-12-30 LAB — PSA: PSA: 0.25 ng/mL (ref 0.10–4.00)

## 2022-12-30 LAB — COMPREHENSIVE METABOLIC PANEL
ALT: 20 U/L (ref 0–53)
AST: 16 U/L (ref 0–37)
Albumin: 4.2 g/dL (ref 3.5–5.2)
Alkaline Phosphatase: 49 U/L (ref 39–117)
BUN: 12 mg/dL (ref 6–23)
CO2: 28 meq/L (ref 19–32)
Calcium: 9 mg/dL (ref 8.4–10.5)
Chloride: 105 meq/L (ref 96–112)
Creatinine, Ser: 1.34 mg/dL (ref 0.40–1.50)
GFR: 53.92 mL/min — ABNORMAL LOW (ref >60.00)
Glucose, Bld: 113 mg/dL — ABNORMAL HIGH (ref 70–99)
Potassium: 4.3 meq/L (ref 3.5–5.1)
Sodium: 138 meq/L (ref 135–145)
Total Bilirubin: 0.4 mg/dL (ref 0.2–1.2)
Total Protein: 6.7 g/dL (ref 6.0–8.3)

## 2022-12-30 LAB — CBC WITH DIFFERENTIAL/PLATELET
Basophils Absolute: 0 10*3/uL (ref 0.0–0.1)
Basophils Relative: 1 % (ref 0.0–3.0)
Eosinophils Absolute: 0.2 10*3/uL (ref 0.0–0.7)
Eosinophils Relative: 4 % (ref 0.0–5.0)
HCT: 43.4 % (ref 39.0–52.0)
Hemoglobin: 14.4 g/dL (ref 13.0–17.0)
Lymphocytes Relative: 35.6 % (ref 12.0–46.0)
Lymphs Abs: 1.4 10*3/uL (ref 0.7–4.0)
MCHC: 33.1 g/dL (ref 30.0–36.0)
MCV: 92 fL (ref 78.0–100.0)
Monocytes Absolute: 0.4 10*3/uL (ref 0.1–1.0)
Monocytes Relative: 9 % (ref 3.0–12.0)
Neutro Abs: 2 10*3/uL (ref 1.4–7.7)
Neutrophils Relative %: 50.4 % (ref 43.0–77.0)
Platelets: 189 10*3/uL (ref 150.0–400.0)
RBC: 4.72 Mil/uL (ref 4.22–5.81)
RDW: 14.1 % (ref 11.5–15.5)
WBC: 3.9 10*3/uL — ABNORMAL LOW (ref 4.0–10.5)

## 2022-12-30 LAB — URINALYSIS
Bilirubin Urine: NEGATIVE
Ketones, ur: NEGATIVE
Leukocytes,Ua: NEGATIVE
Nitrite: NEGATIVE
Specific Gravity, Urine: 1.02 (ref 1.000–1.030)
Total Protein, Urine: NEGATIVE
Urine Glucose: NEGATIVE
Urobilinogen, UA: 0.2 (ref 0.0–1.0)
pH: 6 (ref 5.0–8.0)

## 2022-12-30 LAB — LIPID PANEL
Cholesterol: 122 mg/dL (ref 0–200)
HDL: 36.8 mg/dL — ABNORMAL LOW (ref >39.00)
LDL Cholesterol: 68 mg/dL (ref 0–99)
NonHDL: 85.24
Total CHOL/HDL Ratio: 3
Triglycerides: 85 mg/dL (ref 0.0–149.0)
VLDL: 17 mg/dL (ref 0.0–40.0)

## 2022-12-30 LAB — TSH: TSH: 1.15 u[IU]/mL (ref 0.35–5.50)

## 2023-01-06 ENCOUNTER — Other Ambulatory Visit: Payer: Self-pay | Admitting: Internal Medicine

## 2023-01-10 ENCOUNTER — Encounter: Payer: Self-pay | Admitting: Internal Medicine

## 2023-01-19 ENCOUNTER — Other Ambulatory Visit: Payer: Self-pay | Admitting: Internal Medicine

## 2023-01-19 DIAGNOSIS — M79671 Pain in right foot: Secondary | ICD-10-CM

## 2023-02-03 ENCOUNTER — Ambulatory Visit (INDEPENDENT_AMBULATORY_CARE_PROVIDER_SITE_OTHER): Payer: BC Managed Care – PPO

## 2023-02-03 ENCOUNTER — Ambulatory Visit: Payer: BC Managed Care – PPO | Admitting: Podiatry

## 2023-02-03 ENCOUNTER — Encounter: Payer: Self-pay | Admitting: Podiatry

## 2023-02-03 DIAGNOSIS — M778 Other enthesopathies, not elsewhere classified: Secondary | ICD-10-CM

## 2023-02-03 DIAGNOSIS — M722 Plantar fascial fibromatosis: Secondary | ICD-10-CM

## 2023-02-03 NOTE — Progress Notes (Signed)
Subjective:  [name], male    DOB: 12-30-1955,  MRN: [id number] HPI Chief Complaint  Patient presents with   Foot Pain    Arch right - had a very intense pain in the middle of the night, stabbing sensation x 3 weeks ago, he's felt same area feeling tight, but never had this pain before, tried new shoes-[place] made feet feel some better, still active and working   New Patient (Initial Visit)    67 y.o. male presents with the above complaint.   ROS: Denies fever chills nausea vomiting muscle aches pains calf pain back pain chest pain shortness of breath.  Past Medical History:  Diagnosis Date   COPD (chronic obstructive pulmonary disease) (HCC)    GERD (gastroesophageal reflux disease)    Low back pain    L4-5 disk   No past surgical history on file.  Current Outpatient Medications:    albuterol (VENTOLIN HFA) 108 (90 Base) MCG/ACT inhaler, Inhale 1-2 puffs into the lungs every 4 (four) hours as needed for wheezing or shortness of breath., Disp: 18 g, Rfl: 3   atorvastatin (LIPITOR) 10 MG tablet, Take 1 tablet (10 mg total) by mouth daily. Annual appt is due must see provider for future refills, Disp: 90 tablet, Rfl: 3   budesonide-formoterol (SYMBICORT) 80-4.5 MCG/ACT inhaler, Inhale 2 puffs into the lungs 2 (two) times daily., Disp: 1 each, Rfl: 5   cefdinir (OMNICEF) 300 MG capsule, Take 1 capsule (300 mg total) by mouth 2 (two) times daily., Disp: 20 capsule, Rfl: 1   Cholecalciferol (VITAMIN D3) 25 MCG (1000 UT) capsule, Take 1 capsule (1,000 Units total) by mouth daily., Disp: 100 capsule, Rfl: 3   Diclofenac Sodium (PENNSAID) 2 % SOLN, Place 2 application onto the skin 2 (two) times daily., Disp: 112 g, Rfl: 3   furosemide (LASIX) 20 MG tablet, Take 1 tablet (20 mg total) by mouth daily as needed., Disp: 90 tablet, Rfl: 1   loratadine (CLARITIN) 10 MG tablet, Take 1 tablet (10 mg total) by mouth daily., Disp: 90 tablet, Rfl: 5   LORazepam (ATIVAN) 0.5 MG tablet,  TAKE 1 TABLET BY MOUTH TWICE A DAY AS NEEDED FOR ANXIETY, Disp: 180 tablet, Rfl: 1   olmesartan (BENICAR) 40 MG tablet, Take 1 tablet (40 mg total) by mouth daily., Disp: 90 tablet, Rfl: 2   pantoprazole (PROTONIX) 40 MG tablet, Take 1 tablet (40 mg total) by mouth daily., Disp: 90 tablet, Rfl: 3   scopolamine (TRANSDERM-SCOP) 1 MG/3DAYS, Place 1 patch (1.5 mg total) onto the skin every 3 (three) days., Disp: 4 patch, Rfl: 1   traMADol (ULTRAM) 50 MG tablet, TAKE 1 TABLET BY MOUTH THREE TIMES A DAY AS NEEDED, Disp: 100 tablet, Rfl: 1  Allergies  Allergen Reactions   Aspirin     REACTION: upset stomach - not bad He can take NSAIDS   Norvasc [Amlodipine]     dizzy   Review of Systems Objective:  There were no vitals filed for this visit.  General: Well developed, nourished, in no acute distress, alert and oriented x3   Dermatological: Skin is warm, dry and supple bilateral. Nails x 10 are well maintained; remaining integument appears unremarkable at this time. There are no open sores, no preulcerative lesions, no rash or signs of infection present.  Vascular: Dorsalis Pedis artery and Posterior Tibial artery pedal pulses are 2/4 bilateral with immedate capillary fill time. Pedal hair growth present. No varicosities and no lower extremity edema present bilateral.  Neruologic: Grossly intact via light touch bilateral. Vibratory intact via tuning fork bilateral. Protective threshold with Semmes Wienstein monofilament intact to all pedal sites bilateral. Patellar and Achilles deep tendon reflexes 2+ bilateral. No Babinski or clonus noted bilateral.   Musculoskeletal: No gross boney pedal deformities bilateral. No pain, crepitus, or limitation noted with foot and ankle range of motion bilateral. Muscular strength 5/5 in all groups tested bilateral.  Mild tenderness on palpation medial calcaneal tubercle of the majority of the soreness is in the musculature of the central arch somewhat laterally.   Pes planovalgus bilateral.  Gait: Unassisted, Nonantalgic.    Radiographs:  Radiographs taken today demonstrate osseously mature individual some soft tissue increase in the plantar fascial insertion site.  Pes planovalgus is noted.  Assessment & Plan:   Assessment: Pes planovalgus most likely muscle cramps resulting from mild tinea pedis or pes planovalgus.  He states that he has felt much better since he purchased new shoes.  Plan: Continue the use of good shoes and should this worsen he will notify me.     Kenneth [name] T. [place], [place]

## 2023-06-24 ENCOUNTER — Ambulatory Visit (INDEPENDENT_AMBULATORY_CARE_PROVIDER_SITE_OTHER): Admitting: Internal Medicine

## 2023-06-24 ENCOUNTER — Telehealth: Payer: Self-pay

## 2023-06-24 ENCOUNTER — Encounter: Payer: Self-pay | Admitting: Internal Medicine

## 2023-06-24 ENCOUNTER — Other Ambulatory Visit (HOSPITAL_COMMUNITY): Payer: Self-pay

## 2023-06-24 VITALS — BP 110/64 | HR 55 | Temp 98.0°F | Ht 71.0 in | Wt 233.0 lb

## 2023-06-24 DIAGNOSIS — G8929 Other chronic pain: Secondary | ICD-10-CM | POA: Diagnosis not present

## 2023-06-24 DIAGNOSIS — Z0000 Encounter for general adult medical examination without abnormal findings: Secondary | ICD-10-CM | POA: Diagnosis not present

## 2023-06-24 DIAGNOSIS — M545 Low back pain, unspecified: Secondary | ICD-10-CM | POA: Diagnosis not present

## 2023-06-24 DIAGNOSIS — J069 Acute upper respiratory infection, unspecified: Secondary | ICD-10-CM | POA: Diagnosis not present

## 2023-06-24 MED ORDER — LORAZEPAM 0.5 MG PO TABS: ORAL_TABLET | ORAL | 1 refills | Status: DC

## 2023-06-24 MED ORDER — OLMESARTAN MEDOXOMIL 40 MG PO TABS: 40.0000 mg | ORAL_TABLET | Freq: Every day | ORAL | 2 refills | Status: DC

## 2023-06-24 MED ORDER — BUDESONIDE-FORMOTEROL FUMARATE 80-4.5 MCG/ACT IN AERO: 2.0000 | INHALATION_SPRAY | Freq: Two times a day (BID) | RESPIRATORY_TRACT | 5 refills | Status: AC

## 2023-06-24 MED ORDER — TRAMADOL HCL 50 MG PO TABS: ORAL_TABLET | ORAL | 1 refills | Status: AC

## 2023-06-24 MED ORDER — ATORVASTATIN CALCIUM 10 MG PO TABS: 10.0000 mg | ORAL_TABLET | Freq: Every day | ORAL | 3 refills | Status: AC

## 2023-06-24 MED ORDER — FUROSEMIDE 20 MG PO TABS: 20.0000 mg | ORAL_TABLET | Freq: Every day | ORAL | 1 refills | Status: DC | PRN

## 2023-06-24 MED ORDER — VITAMIN D3 25 MCG (1000 UT) PO CAPS: 1000.0000 [IU] | ORAL_CAPSULE | Freq: Every day | ORAL | 3 refills | Status: AC

## 2023-06-24 MED ORDER — AZITHROMYCIN 250 MG PO TABS: ORAL_TABLET | ORAL | 0 refills | Status: AC

## 2023-06-24 MED ORDER — ALBUTEROL SULFATE HFA 108 (90 BASE) MCG/ACT IN AERS: 1.0000 | INHALATION_SPRAY | RESPIRATORY_TRACT | 3 refills | Status: DC | PRN

## 2023-06-24 MED ORDER — SCOPOLAMINE 1 MG/3DAYS TD PT72: 1.0000 | MEDICATED_PATCH | TRANSDERMAL | 1 refills | Status: DC

## 2023-06-24 MED ORDER — PANTOPRAZOLE SODIUM 40 MG PO TBEC: 40.0000 mg | DELAYED_RELEASE_TABLET | Freq: Every day | ORAL | 3 refills | Status: AC

## 2023-06-24 MED ORDER — LORATADINE 10 MG PO TABS: 10.0000 mg | ORAL_TABLET | Freq: Every day | ORAL | 5 refills | Status: AC

## 2023-06-24 NOTE — Assessment & Plan Note (Signed)
 Same pains - OA

## 2023-06-24 NOTE — Assessment & Plan Note (Signed)
 We discussed age appropriate health related issues, including available/recomended screening tests and vaccinations. We discussed a need for adhering to healthy diet and exercise. Labs were ordered to be later reviewed . All questions were answered. Pt opted for Cologuard 2021, due in 2024 - not done yet (he has the box at home). Coronary calcium  CT offered 10/20. Coronary CT calcium  score of 83 - 2021

## 2023-06-24 NOTE — Assessment & Plan Note (Signed)
New Z pack

## 2023-06-24 NOTE — Telephone Encounter (Signed)
 Pharmacy Patient Advocate Encounter   Received notification from CoverMyMeds that prior authorization for traMADol  HCl 50MG  tablets  is required/requested.   Insurance verification completed.   The patient is insured through [facility] .   Per test claim: PA required; PA started via CoverMyMeds. KEY [id number] . Waiting for clinical questions to populate.

## 2023-06-24 NOTE — Progress Notes (Signed)
 Subjective:  [name], male    DOB: Dec 30, 1955  Age: 68 y.o. MRN: [id number]  CC: Annual Exam   HPI Kenneth [name] presents for a well exam C/o URI  Outpatient Medications Prior to Visit  Medication Sig Dispense Refill   Diclofenac  Sodium (PENNSAID ) 2 % SOLN Place 2 application onto the skin 2 (two) times daily. 112 g 3   albuterol  (VENTOLIN  HFA) 108 (90 Base) MCG/ACT inhaler Inhale 1-2 puffs into the lungs every 4 (four) hours as needed for wheezing or shortness of breath. 18 g 3   atorvastatin  (LIPITOR) 10 MG tablet Take 1 tablet (10 mg total) by mouth daily. Annual appt is due must see provider for future refills 90 tablet 3   budesonide -formoterol  (SYMBICORT ) 80-4.5 MCG/ACT inhaler Inhale 2 puffs into the lungs 2 (two) times daily. 1 each 5   Cholecalciferol (VITAMIN D3) 25 MCG (1000 UT) capsule Take 1 capsule (1,000 Units total) by mouth daily. 100 capsule 3   furosemide  (LASIX ) 20 MG tablet Take 1 tablet (20 mg total) by mouth daily as needed. 90 tablet 1   loratadine  (CLARITIN ) 10 MG tablet Take 1 tablet (10 mg total) by mouth daily. 90 tablet 5   LORazepam  (ATIVAN ) 0.5 MG tablet TAKE 1 TABLET BY MOUTH TWICE A DAY AS NEEDED FOR ANXIETY 180 tablet 1   olmesartan  (BENICAR ) 40 MG tablet Take 1 tablet (40 mg total) by mouth daily. 90 tablet 2   pantoprazole  (PROTONIX ) 40 MG tablet Take 1 tablet (40 mg total) by mouth daily. 90 tablet 3   scopolamine  (TRANSDERM-SCOP) 1 MG/3DAYS Place 1 patch (1.5 mg total) onto the skin every 3 (three) days. 4 patch 1   traMADol  (ULTRAM ) 50 MG tablet TAKE 1 TABLET BY MOUTH THREE TIMES A DAY AS NEEDED 100 tablet 1   cefdinir  (OMNICEF ) 300 MG capsule Take 1 capsule (300 mg total) by mouth 2 (two) times daily. (Patient not taking: Reported on 06/24/2023) 20 capsule 1   No facility-administered medications prior to visit.    ROS: Review of Systems  Constitutional:  Negative for appetite change, fatigue and unexpected weight change.  HENT:   Negative for congestion, nosebleeds, sneezing, sore throat and trouble swallowing.   Eyes:  Negative for itching and visual disturbance.  Respiratory:  Negative for cough.   Cardiovascular:  Negative for chest pain, palpitations and leg swelling.  Gastrointestinal:  Negative for abdominal distention, blood in stool, diarrhea and nausea.  Genitourinary:  Negative for frequency and hematuria.  Musculoskeletal:  Negative for back pain, gait problem, joint swelling and neck pain.  Skin:  Negative for rash.  Neurological:  Negative for dizziness, tremors, speech difficulty and weakness.  Psychiatric/Behavioral:  Negative for agitation, dysphoric mood and sleep disturbance. The patient is not nervous/anxious.     Objective:  BP 110/64   Pulse (!) 55   Temp 98 F (36.7 C) (Oral)   Ht 5\' 11"  (1.803 m)   Wt 233 lb (105.7 kg)   SpO2 93%   BMI 32.50 kg/m   BP Readings from Last 3 Encounters:  06/24/23 110/64  10/26/22 118/78  05/27/22 110/78    Wt Readings from Last 3 Encounters:  06/24/23 233 lb (105.7 kg)  10/26/22 235 lb (106.6 kg)  05/27/22 233 lb (105.7 kg)    Physical Exam Constitutional:      General: He is not in acute distress.    Appearance: He is well-developed.     Comments: NAD  Eyes:  Conjunctiva/sclera: Conjunctivae normal.     Pupils: Pupils are equal, round, and reactive to light.  Neck:     Thyroid : No thyromegaly.     Vascular: No JVD.  Cardiovascular:     Rate and Rhythm: Normal rate and regular rhythm.     Heart sounds: Normal heart sounds. No murmur heard.    No friction rub. No gallop.  Pulmonary:     Effort: Pulmonary effort is normal. No respiratory distress.     Breath sounds: Normal breath sounds. No wheezing or rales.  Chest:     Chest wall: No tenderness.  Abdominal:     General: Bowel sounds are normal. There is no distension.     Palpations: Abdomen is soft. There is no mass.     Tenderness: There is no abdominal tenderness. There is no  guarding or rebound.  Musculoskeletal:        General: No tenderness. Normal range of motion.     Cervical back: Normal range of motion.     Right lower leg: No edema.     Left lower leg: No edema.  Lymphadenopathy:     Cervical: No cervical adenopathy.  Skin:    General: Skin is warm and dry.     Findings: No rash.  Neurological:     Mental Status: He is alert and oriented to person, place, and time.     Cranial Nerves: No cranial nerve deficit.     Motor: No abnormal muscle tone.     Coordination: Coordination normal.     Gait: Gait normal.     Deep Tendon Reflexes: Reflexes are normal and symmetric.  Psychiatric:        Behavior: Behavior normal.        Thought Content: Thought content normal.        Judgment: Judgment normal.   Rectal - deffered Eryth throat  Lab Results  Component Value Date   WBC 3.9 (L) 12/30/2022   HGB 14.4 12/30/2022   HCT 43.4 12/30/2022   PLT 189.0 12/30/2022   GLUCOSE 113 (H) 12/30/2022   CHOL 122 12/30/2022   TRIG 85.0 12/30/2022   HDL 36.80 (L) 12/30/2022   LDLDIRECT 68.0 12/25/2019   LDLCALC 68 12/30/2022   ALT 20 12/30/2022   AST 16 12/30/2022   NA 138 12/30/2022   K 4.3 12/30/2022   CL 105 12/30/2022   CREATININE 1.34 12/30/2022   BUN 12 12/30/2022   CO2 28 12/30/2022   TSH 1.15 12/30/2022   PSA 0.25 12/30/2022   HGBA1C 6.1 04/09/2013    No results found.  Assessment & Plan:   Problem List Items Addressed This Visit     Acute upper respiratory infection - Primary   New Zpack       Relevant Medications   azithromycin (ZITHROMAX Z-PAK) 250 MG tablet   LOW BACK PAIN   Same pains - OA      Relevant Medications   traMADol  (ULTRAM ) 50 MG tablet   Well adult exam   We discussed age appropriate health related issues, including available/recomended screening tests and vaccinations. We discussed a need for adhering to healthy diet and exercise. Labs were ordered to be later reviewed . All questions were answered. Pt  opted for Cologuard 2021, due in 2024 - not done yet (he has the box at home). Coronary calcium  CT offered 10/20. Coronary CT calcium  score of 83 - 2021         Meds ordered this encounter  Medications  albuterol  (VENTOLIN  HFA) 108 (90 Base) MCG/ACT inhaler    Sig: Inhale 1-2 puffs into the lungs every 4 (four) hours as needed for wheezing or shortness of breath.    Dispense:  18 g    Refill:  3   atorvastatin  (LIPITOR) 10 MG tablet    Sig: Take 1 tablet (10 mg total) by mouth daily. Annual appt is due must see provider for future refills    Dispense:  90 tablet    Refill:  3   budesonide -formoterol  (SYMBICORT ) 80-4.5 MCG/ACT inhaler    Sig: Inhale 2 puffs into the lungs 2 (two) times daily.    Dispense:  1 each    Refill:  5   Cholecalciferol (VITAMIN D3) 25 MCG (1000 UT) CAPS    Sig: Take 1 capsule (1,000 Units total) by mouth daily.    Dispense:  100 capsule    Refill:  3   furosemide  (LASIX ) 20 MG tablet    Sig: Take 1 tablet (20 mg total) by mouth daily as needed.    Dispense:  90 tablet    Refill:  1   loratadine  (CLARITIN ) 10 MG tablet    Sig: Take 1 tablet (10 mg total) by mouth daily.    Dispense:  90 tablet    Refill:  5   LORazepam  (ATIVAN ) 0.5 MG tablet    Sig: TAKE 1 TABLET BY MOUTH TWICE A DAY AS NEEDED FOR ANXIETY    Dispense:  180 tablet    Refill:  1    This request is for a new prescription for a controlled substance as required by Federal/State law.   olmesartan  (BENICAR ) 40 MG tablet    Sig: Take 1 tablet (40 mg total) by mouth daily.    Dispense:  90 tablet    Refill:  2   pantoprazole  (PROTONIX ) 40 MG tablet    Sig: Take 1 tablet (40 mg total) by mouth daily.    Dispense:  90 tablet    Refill:  3   scopolamine  (TRANSDERM-SCOP) 1 MG/3DAYS    Sig: Place 1 patch (1.5 mg total) onto the skin every 3 (three) days.    Dispense:  4 patch    Refill:  1   traMADol  (ULTRAM ) 50 MG tablet    Sig: TAKE 1 TABLET BY MOUTH THREE TIMES A DAY AS NEEDED     Dispense:  100 tablet    Refill:  1    This request is for a new prescription for a controlled substance as required by Federal/State law.   azithromycin (ZITHROMAX Z-PAK) 250 MG tablet    Sig: As directed    Dispense:  6 tablet    Refill:  0      Follow-up: Return in about 6 months (around 12/25/2023) for a follow-up visit.  Anitra Barn, MD

## 2023-06-27 ENCOUNTER — Other Ambulatory Visit (HOSPITAL_COMMUNITY): Payer: Self-pay

## 2023-06-27 NOTE — Telephone Encounter (Signed)
 Clinical questions have been answered and PA submitted.TO PLAN. PA currently Pending.

## 2023-06-28 ENCOUNTER — Other Ambulatory Visit (HOSPITAL_COMMUNITY): Payer: Self-pay

## 2023-06-28 NOTE — Telephone Encounter (Signed)
 Pharmacy Patient Advocate Encounter  Received notification from [facility] that Prior Authorization for TRAMADOL  50MG  has been APPROVED from 06/27/23 to 12/28/23. Unable to obtain price due to refill too soon rejection, last fill date 06/27/23 next available fill date6/7/25   PA #/Case ID/Reference #: [id number]

## 2023-10-04 ENCOUNTER — Encounter: Payer: Self-pay | Admitting: Internal Medicine

## 2023-10-28 ENCOUNTER — Encounter: Payer: Self-pay | Admitting: Internal Medicine

## 2023-11-01 ENCOUNTER — Other Ambulatory Visit: Payer: Self-pay | Admitting: Internal Medicine

## 2023-11-02 ENCOUNTER — Other Ambulatory Visit: Payer: Self-pay | Admitting: Internal Medicine

## 2023-11-02 MED ORDER — COVID-19 MRNA VAC-TRIS(PFIZER) 30 MCG/0.3ML IM SUSY: 0.3000 mL | PREFILLED_SYRINGE | Freq: Once | INTRAMUSCULAR | 0 refills | Status: AC

## 2023-11-10 ENCOUNTER — Other Ambulatory Visit: Payer: Self-pay | Admitting: Internal Medicine

## 2023-11-10 MED ORDER — NIRMATRELVIR/RITONAVIR (PAXLOVID) TABLET (RENAL DOSING): 2.0000 | ORAL_TABLET | Freq: Two times a day (BID) | ORAL | 0 refills | Status: AC

## 2023-12-09 ENCOUNTER — Ambulatory Visit (INDEPENDENT_AMBULATORY_CARE_PROVIDER_SITE_OTHER)

## 2023-12-09 ENCOUNTER — Encounter: Payer: Self-pay | Admitting: Internal Medicine

## 2023-12-09 ENCOUNTER — Ambulatory Visit: Admitting: Internal Medicine

## 2023-12-09 VITALS — BP 126/78 | HR 85 | Temp 98.2°F | Ht 71.0 in | Wt 231.0 lb

## 2023-12-09 DIAGNOSIS — J069 Acute upper respiratory infection, unspecified: Secondary | ICD-10-CM

## 2023-12-09 DIAGNOSIS — J4520 Mild intermittent asthma, uncomplicated: Secondary | ICD-10-CM

## 2023-12-09 LAB — POC COVID19 BINAXNOW: SARS Coronavirus 2 Ag: NEGATIVE

## 2023-12-09 MED ORDER — ALBUTEROL SULFATE HFA 108 (90 BASE) MCG/ACT IN AERS: 1.0000 | INHALATION_SPRAY | RESPIRATORY_TRACT | 3 refills | Status: AC | PRN

## 2023-12-09 MED ORDER — METHYLPREDNISOLONE 4 MG PO TBPK: ORAL_TABLET | ORAL | 0 refills | Status: DC

## 2023-12-09 MED ORDER — PROMETHAZINE-DM 6.25-15 MG/5ML PO SYRP: 5.0000 mL | ORAL_SOLUTION | Freq: Four times a day (QID) | ORAL | 0 refills | Status: AC | PRN

## 2023-12-09 MED ORDER — CEFDINIR 300 MG PO CAPS: 300.0000 mg | ORAL_CAPSULE | Freq: Two times a day (BID) | ORAL | 1 refills | Status: AC

## 2023-12-09 NOTE — Assessment & Plan Note (Signed)
 Start Cefdinir  po COVID POC test

## 2023-12-09 NOTE — Assessment & Plan Note (Signed)
 Worse Medrol  pack Start Albuterol  MDI

## 2023-12-09 NOTE — Progress Notes (Signed)
 Subjective:  [name], male    DOB: 02-Jan-1956  Age: 68 y.o. MRN: [id number]  CC: No chief complaint on file.   HPI [name] presents for URI x 7 d C/o congestion, cough, wheezing  Outpatient Medications Prior to Visit  Medication Sig Dispense Refill   atorvastatin  (LIPITOR) 10 MG tablet Take 1 tablet (10 mg total) by mouth daily. Annual appt is due must see provider for future refills 90 tablet 3   azithromycin  (ZITHROMAX  Z-PAK) 250 MG tablet As directed 6 tablet 0   budesonide -formoterol  (SYMBICORT ) 80-4.5 MCG/ACT inhaler Inhale 2 puffs into the lungs 2 (two) times daily. 1 each 5   Cholecalciferol (VITAMIN D3) 25 MCG (1000 UT) CAPS Take 1 capsule (1,000 Units total) by mouth daily. 100 capsule 3   Diclofenac  Sodium (PENNSAID ) 2 % SOLN Place 2 application onto the skin 2 (two) times daily. 112 g 3   furosemide  (LASIX ) 20 MG tablet Take 1 tablet (20 mg total) by mouth daily as needed. 90 tablet 1   loratadine  (CLARITIN ) 10 MG tablet Take 1 tablet (10 mg total) by mouth daily. 90 tablet 5   LORazepam  (ATIVAN ) 0.5 MG tablet TAKE 1 TABLET BY MOUTH TWICE A DAY AS NEEDED FOR ANXIETY 180 tablet 1   olmesartan  (BENICAR ) 40 MG tablet Take 1 tablet (40 mg total) by mouth daily. 90 tablet 2   pantoprazole  (PROTONIX ) 40 MG tablet Take 1 tablet (40 mg total) by mouth daily. 90 tablet 3   scopolamine  (TRANSDERM-SCOP) 1 MG/3DAYS Place 1 patch (1.5 mg total) onto the skin every 3 (three) days. 4 patch 1   traMADol  (ULTRAM ) 50 MG tablet TAKE 1 TABLET BY MOUTH THREE TIMES A DAY AS NEEDED 100 tablet 1   albuterol  (VENTOLIN  HFA) 108 (90 Base) MCG/ACT inhaler Inhale 1-2 puffs into the lungs every 4 (four) hours as needed for wheezing or shortness of breath. 18 g 3   cefdinir  (OMNICEF ) 300 MG capsule Take 1 capsule (300 mg total) by mouth 2 (two) times daily. (Patient not taking: Reported on 12/09/2023) 20 capsule 1   No facility-administered medications prior to visit.    ROS: Review of  Systems  Constitutional:  Negative for appetite change, fatigue and unexpected weight change.  HENT:  Negative for congestion, nosebleeds, sneezing, sore throat and trouble swallowing.   Eyes:  Negative for itching and visual disturbance.  Respiratory:  Positive for cough, chest tightness, shortness of breath and wheezing. Negative for stridor.   Cardiovascular:  Negative for chest pain, palpitations and leg swelling.  Gastrointestinal:  Negative for abdominal distention, blood in stool, diarrhea and nausea.  Genitourinary:  Negative for frequency and hematuria.  Musculoskeletal:  Negative for back pain, gait problem, joint swelling and neck pain.  Skin:  Negative for rash.  Neurological:  Negative for dizziness, tremors, speech difficulty and weakness.  Psychiatric/Behavioral:  Negative for agitation, dysphoric mood and sleep disturbance. The patient is not nervous/anxious.     Objective:  BP 126/78   Pulse 85   Temp 98.2 F (36.8 C)   Ht 5' 11 (1.803 m)   Wt 231 lb (104.8 kg)   SpO2 97%   BMI 32.22 kg/m   BP Readings from Last 3 Encounters:  12/09/23 126/78  06/24/23 110/64  10/26/22 118/78    Wt Readings from Last 3 Encounters:  12/09/23 231 lb (104.8 kg)  06/24/23 233 lb (105.7 kg)  10/26/22 235 lb (106.6 kg)    Physical Exam Constitutional:  General: He is not in acute distress.    Appearance: He is well-developed. He is not toxic-appearing.     Comments: NAD  HENT:     Nose: Congestion and rhinorrhea present.     Mouth/Throat:     Pharynx: Posterior oropharyngeal erythema present. No oropharyngeal exudate.  Eyes:     Conjunctiva/sclera: Conjunctivae normal.     Pupils: Pupils are equal, round, and reactive to light.  Neck:     Thyroid : No thyromegaly.     Vascular: No JVD.  Cardiovascular:     Rate and Rhythm: Normal rate and regular rhythm.     Heart sounds: Normal heart sounds. No murmur heard.    No friction rub. No gallop.  Pulmonary:      Effort: Pulmonary effort is normal. No respiratory distress.     Breath sounds: Wheezing and rhonchi present. No rales.  Chest:     Chest wall: No tenderness.  Abdominal:     General: Bowel sounds are normal. There is no distension.     Palpations: Abdomen is soft. There is no mass.     Tenderness: There is no abdominal tenderness. There is no guarding or rebound.  Musculoskeletal:        General: No tenderness. Normal range of motion.     Cervical back: Normal range of motion.  Lymphadenopathy:     Cervical: No cervical adenopathy.  Skin:    General: Skin is warm and dry.     Findings: No rash.  Neurological:     Mental Status: He is alert and oriented to person, place, and time.     Cranial Nerves: No cranial nerve deficit.     Motor: No abnormal muscle tone.     Coordination: Coordination normal.     Gait: Gait normal.     Deep Tendon Reflexes: Reflexes are normal and symmetric.  Psychiatric:        Behavior: Behavior normal.        Thought Content: Thought content normal.        Judgment: Judgment normal.    COVID (-)   Lab Results  Component Value Date   WBC 3.9 (L) 12/30/2022   HGB 14.4 12/30/2022   HCT 43.4 12/30/2022   PLT 189.0 12/30/2022   GLUCOSE 113 (H) 12/30/2022   CHOL 122 12/30/2022   TRIG 85.0 12/30/2022   HDL 36.80 (L) 12/30/2022   LDLDIRECT 68.0 12/25/2019   LDLCALC 68 12/30/2022   ALT 20 12/30/2022   AST 16 12/30/2022   NA 138 12/30/2022   K 4.3 12/30/2022   CL 105 12/30/2022   CREATININE 1.34 12/30/2022   BUN 12 12/30/2022   CO2 28 12/30/2022   TSH 1.15 12/30/2022   PSA 0.25 12/30/2022   HGBA1C 6.1 04/09/2013    No results found.  Assessment & Plan:   Problem List Items Addressed This Visit     Acute upper respiratory infection - Primary   Start Cefdinir  po COVID POC test      Relevant Medications   cefdinir  (OMNICEF ) 300 MG capsule   Other Relevant Orders   DG Chest 2 View   Asthma   Worse Medrol  pack Start Albuterol   MDI      Relevant Medications   methylPREDNISolone  (MEDROL  DOSEPAK) 4 MG TBPK tablet   albuterol  (VENTOLIN  HFA) 108 (90 Base) MCG/ACT inhaler      Meds ordered this encounter  Medications   methylPREDNISolone  (MEDROL  DOSEPAK) 4 MG TBPK tablet    Sig: As  directed    Dispense:  21 tablet    Refill:  0   promethazine-dextromethorphan (PROMETHAZINE-DM) 6.25-15 MG/5ML syrup    Sig: Take 5 mLs by mouth 4 (four) times daily as needed for cough.    Dispense:  240 mL    Refill:  0   cefdinir  (OMNICEF ) 300 MG capsule    Sig: Take 1 capsule (300 mg total) by mouth 2 (two) times daily.    Dispense:  20 capsule    Refill:  1   albuterol  (VENTOLIN  HFA) 108 (90 Base) MCG/ACT inhaler    Sig: Inhale 1-2 puffs into the lungs every 4 (four) hours as needed for wheezing or shortness of breath.    Dispense:  8 g    Refill:  3      Follow-up: Return in about 3 months (around 03/10/2024) for Wellness Exam.  Marolyn Noel, MD

## 2023-12-09 NOTE — Addendum Note (Signed)
 Addended byBETHA LUCETTA CLEATRICE LELON on: 12/09/2023 11:05 AM   Modules accepted: Orders

## 2023-12-11 ENCOUNTER — Ambulatory Visit: Payer: Self-pay | Admitting: Internal Medicine

## 2023-12-22 ENCOUNTER — Other Ambulatory Visit: Payer: Self-pay | Admitting: Internal Medicine

## 2024-01-02 IMAGING — DX DG CERVICAL SPINE COMPLETE 4+V
5 series · 5 of 5 positions shown · non-contrast
Comparison: Cervical spine radiograph 08/23/2016.

CLINICAL DATA: Right cervical radiculopathy for 3-1/2 months.
Patient notes symptom onset after lifting luggage.

EXAM:
CERVICAL SPINE - COMPLETE 4+ VIEW

[c-spine lat]
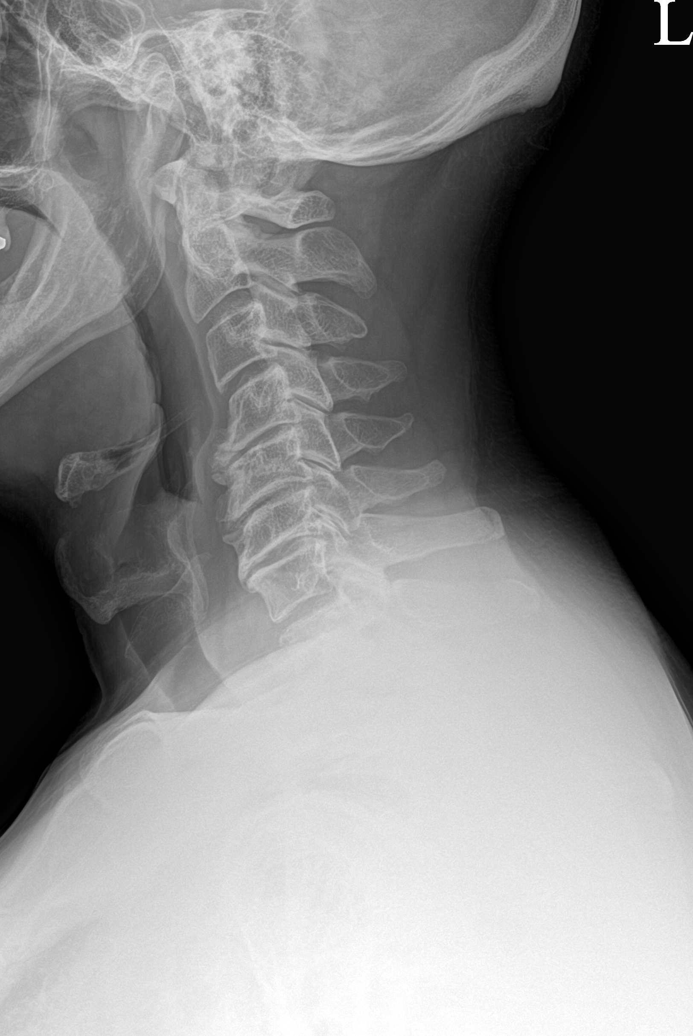

[c-spine obl (1 of 2)]
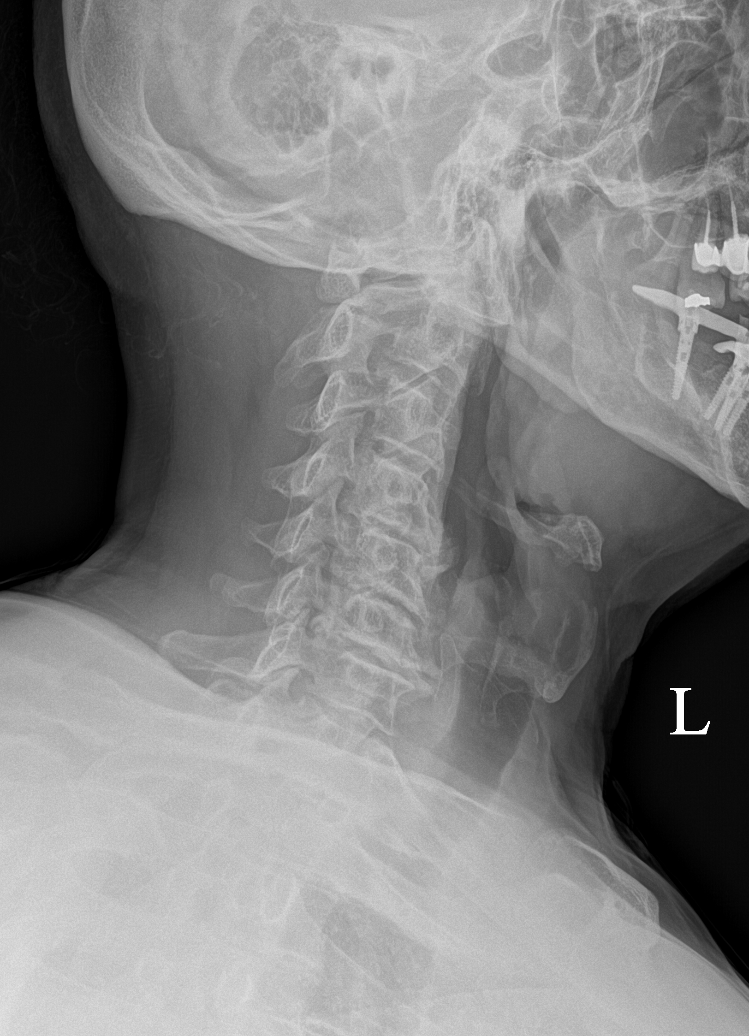

[c-spine obl (2 of 2)]
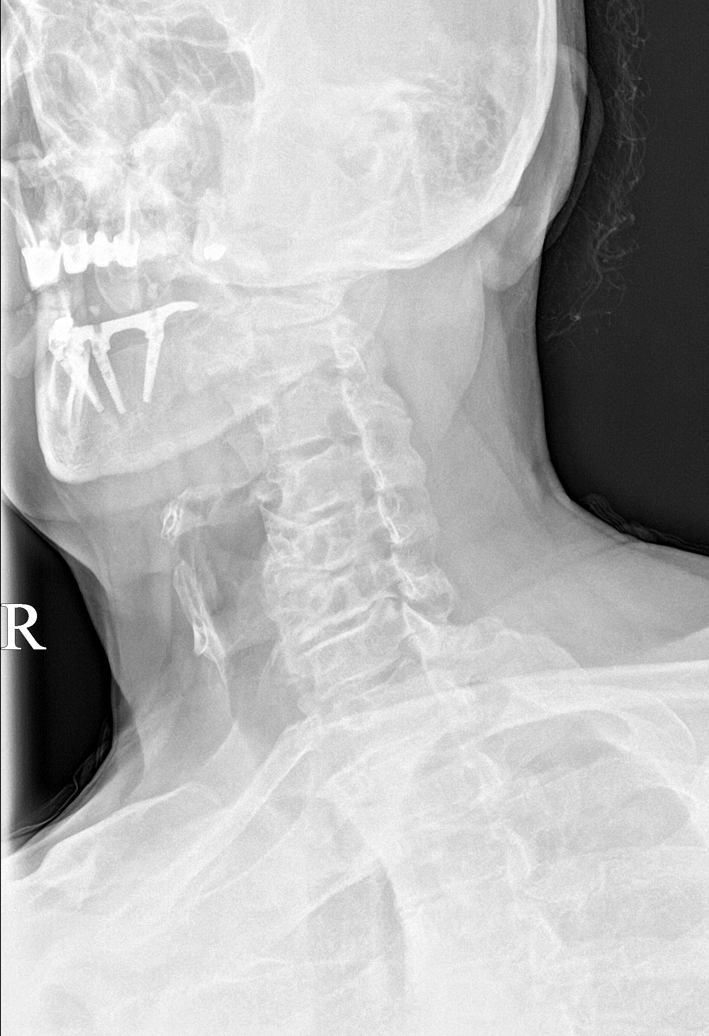

[c-spine ap]
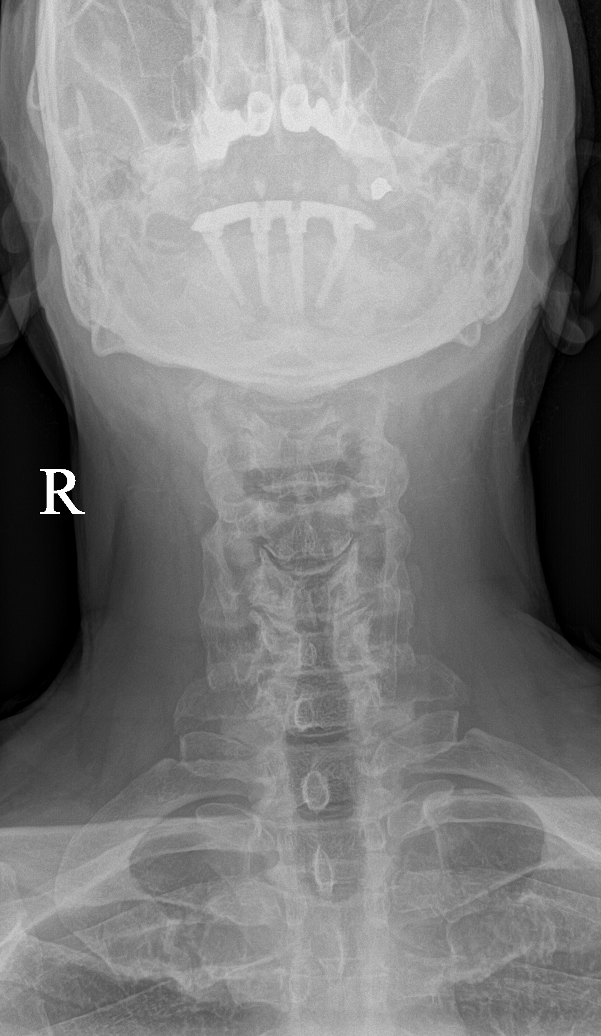

[c-spine open mouth]
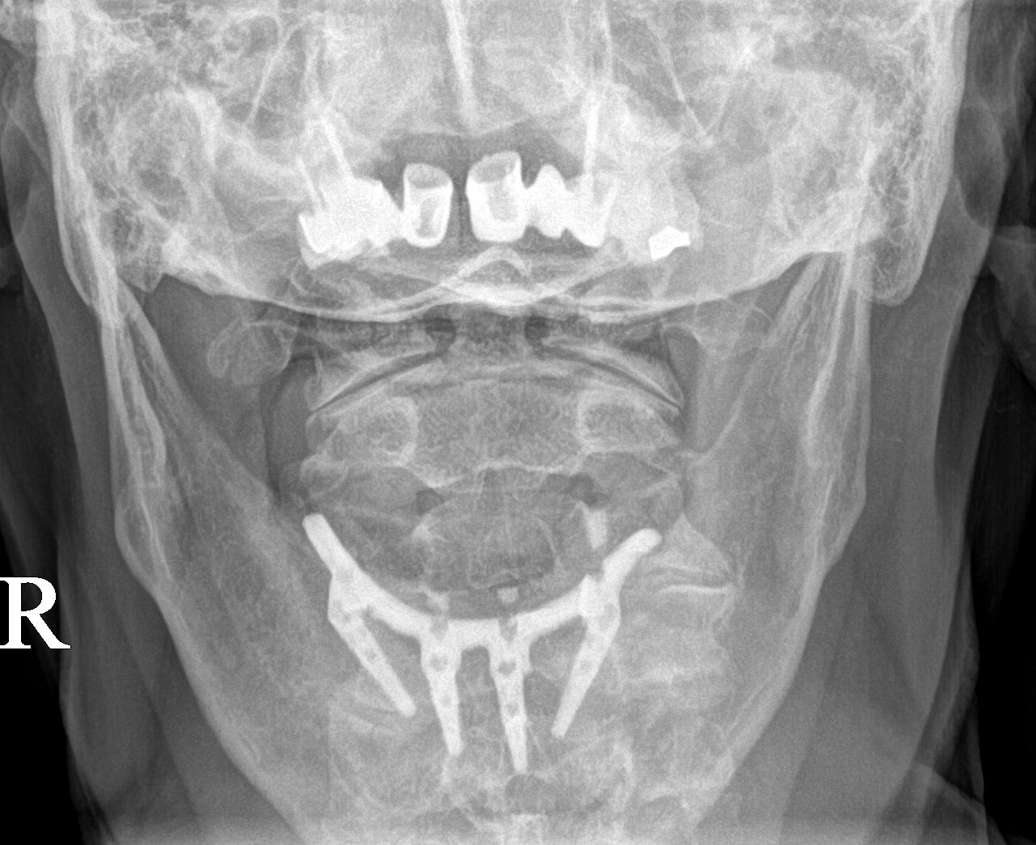

[5 of 5 positions shown; findings below may reference images not displayed]

FINDINGS: Again seen straightening of normal lordosis. Trace retrolisthesis of
C4 on C5. Disc space narrowing and anterior spurring C4-C5 through
C6-C7, not significantly changed in the interim. There is mild
multilevel facet hypertrophy. Bilateral neural foraminal narrowing
at C5-C6 and C6-C7, there is also neural foraminal narrowing at
C4-C5 on the right. There is no evidence of fracture, focal bone
lesion, or bone destruction. No prevertebral soft tissue thickening.
IMPRESSION: Multilevel degenerative disc disease and facet hypertrophy with
bilateral neural foraminal narrowing at C5-C6 and C6-C7 and neural
foraminal narrowing at C4-C5 on the right.

## 2024-01-23 ENCOUNTER — Encounter: Admitting: Internal Medicine

## 2024-02-17 ENCOUNTER — Other Ambulatory Visit: Payer: Self-pay | Admitting: Internal Medicine

## 2024-03-14 ENCOUNTER — Other Ambulatory Visit: Payer: Self-pay | Admitting: Internal Medicine
